# Patient Record
Sex: Male | Born: 1935 | Race: White | Hispanic: No | Marital: Single
Health system: Southern US, Community
[De-identification: ages and names within clinical notes are randomized; demographics above are authoritative.]

---

## 2004-07-26 ENCOUNTER — Ambulatory Visit: Payer: Self-pay | Admitting: Gastroenterology

## 2004-10-25 ENCOUNTER — Ambulatory Visit: Payer: Self-pay | Admitting: Gastroenterology

## 2005-01-09 ENCOUNTER — Ambulatory Visit: Payer: Self-pay | Admitting: Gastroenterology

## 2005-01-28 ENCOUNTER — Encounter: Payer: Self-pay | Admitting: Gastroenterology

## 2005-10-24 ENCOUNTER — Ambulatory Visit: Payer: Self-pay | Admitting: Family Medicine

## 2005-11-06 ENCOUNTER — Ambulatory Visit: Payer: Self-pay | Admitting: Family Medicine

## 2005-11-15 ENCOUNTER — Ambulatory Visit: Payer: Self-pay | Admitting: Internal Medicine

## 2005-12-07 ENCOUNTER — Emergency Department: Payer: Self-pay | Admitting: Emergency Medicine

## 2006-02-16 ENCOUNTER — Other Ambulatory Visit: Payer: Self-pay

## 2006-02-17 ENCOUNTER — Other Ambulatory Visit: Payer: Self-pay

## 2006-02-17 ENCOUNTER — Inpatient Hospital Stay: Payer: Self-pay | Admitting: Internal Medicine

## 2006-02-18 ENCOUNTER — Other Ambulatory Visit: Payer: Self-pay

## 2007-05-17 ENCOUNTER — Ambulatory Visit: Payer: Self-pay | Admitting: Emergency Medicine

## 2007-12-01 ENCOUNTER — Other Ambulatory Visit: Payer: Self-pay

## 2007-12-01 ENCOUNTER — Ambulatory Visit: Payer: Self-pay | Admitting: Family Medicine

## 2007-12-01 ENCOUNTER — Inpatient Hospital Stay: Payer: Self-pay | Admitting: Specialist

## 2008-02-03 ENCOUNTER — Ambulatory Visit: Payer: Self-pay | Admitting: Family Medicine

## 2008-02-25 ENCOUNTER — Other Ambulatory Visit: Payer: Self-pay

## 2008-02-25 ENCOUNTER — Inpatient Hospital Stay: Payer: Self-pay | Admitting: Internal Medicine

## 2008-02-29 ENCOUNTER — Ambulatory Visit: Payer: Self-pay | Admitting: Vascular Surgery

## 2008-03-04 ENCOUNTER — Inpatient Hospital Stay: Payer: Self-pay | Admitting: Surgery

## 2008-03-31 ENCOUNTER — Inpatient Hospital Stay: Payer: Self-pay | Admitting: Internal Medicine

## 2008-04-19 ENCOUNTER — Ambulatory Visit: Payer: Self-pay | Admitting: Oncology

## 2008-04-20 ENCOUNTER — Ambulatory Visit: Payer: Self-pay | Admitting: Gastroenterology

## 2008-05-02 ENCOUNTER — Ambulatory Visit: Payer: Self-pay | Admitting: Family Medicine

## 2008-05-20 ENCOUNTER — Ambulatory Visit: Payer: Self-pay | Admitting: Oncology

## 2008-06-14 ENCOUNTER — Inpatient Hospital Stay: Payer: Self-pay | Admitting: Internal Medicine

## 2008-07-04 ENCOUNTER — Encounter: Payer: Self-pay | Admitting: Cardiovascular Disease

## 2008-07-21 ENCOUNTER — Encounter: Payer: Self-pay | Admitting: Cardiovascular Disease

## 2008-08-13 ENCOUNTER — Inpatient Hospital Stay: Payer: Self-pay | Admitting: *Deleted

## 2008-08-18 ENCOUNTER — Encounter: Payer: Self-pay | Admitting: Cardiovascular Disease

## 2008-08-31 ENCOUNTER — Emergency Department: Payer: Self-pay | Admitting: Emergency Medicine

## 2008-09-17 ENCOUNTER — Encounter: Payer: Self-pay | Admitting: Cardiovascular Disease

## 2008-10-02 ENCOUNTER — Emergency Department: Payer: Self-pay | Admitting: Emergency Medicine

## 2008-10-04 ENCOUNTER — Ambulatory Visit: Payer: Self-pay

## 2008-10-07 ENCOUNTER — Emergency Department: Payer: Self-pay | Admitting: Emergency Medicine

## 2008-10-12 ENCOUNTER — Emergency Department: Payer: Self-pay

## 2008-10-16 ENCOUNTER — Emergency Department: Payer: Self-pay | Admitting: Emergency Medicine

## 2008-10-18 ENCOUNTER — Encounter: Payer: Self-pay | Admitting: Cardiovascular Disease

## 2008-10-21 ENCOUNTER — Ambulatory Visit: Payer: Self-pay | Admitting: Gastroenterology

## 2008-10-28 ENCOUNTER — Ambulatory Visit: Payer: Self-pay | Admitting: Gastroenterology

## 2008-11-27 ENCOUNTER — Ambulatory Visit: Payer: Self-pay | Admitting: Internal Medicine

## 2008-11-30 ENCOUNTER — Ambulatory Visit: Payer: Self-pay | Admitting: Gastroenterology

## 2009-04-03 ENCOUNTER — Ambulatory Visit: Payer: Self-pay | Admitting: Gastroenterology

## 2009-05-29 ENCOUNTER — Ambulatory Visit: Payer: Self-pay | Admitting: Urology

## 2009-06-08 ENCOUNTER — Encounter: Payer: Self-pay | Admitting: Family Medicine

## 2009-06-20 ENCOUNTER — Encounter: Payer: Self-pay | Admitting: Family Medicine

## 2009-06-27 ENCOUNTER — Ambulatory Visit: Payer: Self-pay | Admitting: Family Medicine

## 2009-07-18 ENCOUNTER — Encounter: Payer: Self-pay | Admitting: Family Medicine

## 2009-08-18 ENCOUNTER — Encounter: Payer: Self-pay | Admitting: Family Medicine

## 2010-01-02 IMAGING — US ABDOMEN ULTRASOUND
1 series · 17 of 25 positions shown · non-contrast
Comparison: none

REASON FOR EXAM: RUQ/epigastric pain - h/o cholecystectomy
COMMENTS:

PROCEDURE:     US  - US ABDOMEN GENERAL SURVEY  - August 31, 2008 [DATE]
RESULT:     Comparison: CT abdomen 10/24/2005 and 11/15/2005, ultrasound
02/03/2008
TECHNIQUE: Multiple gray-scale and color-flow Doppler images of the abdomen
are presented for review.

[Series 1: abdomen ultrasound · 17 of 43 slices shown]
[im 1/43]
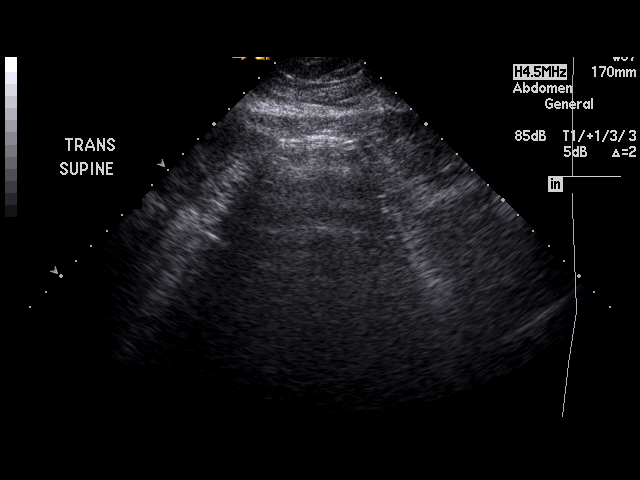
[im 4/43]
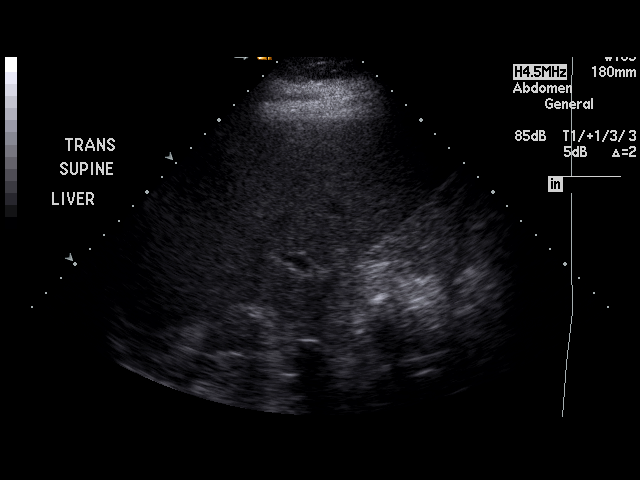
[im 6/43]
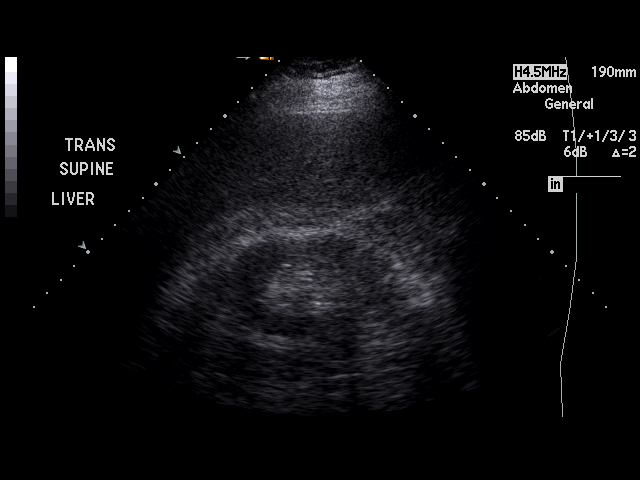
[im 9/43]
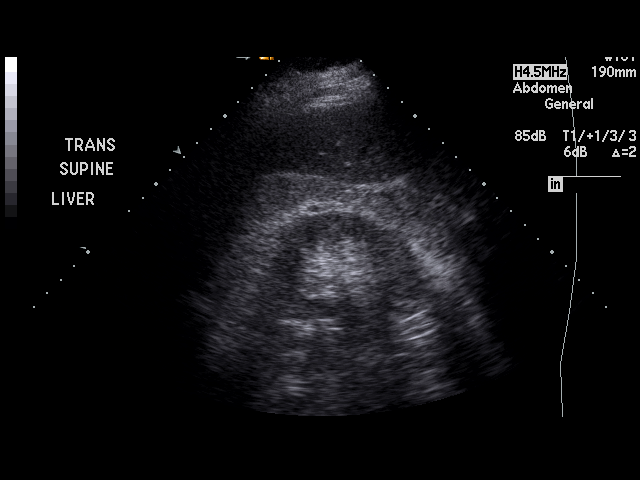
[im 11/43]
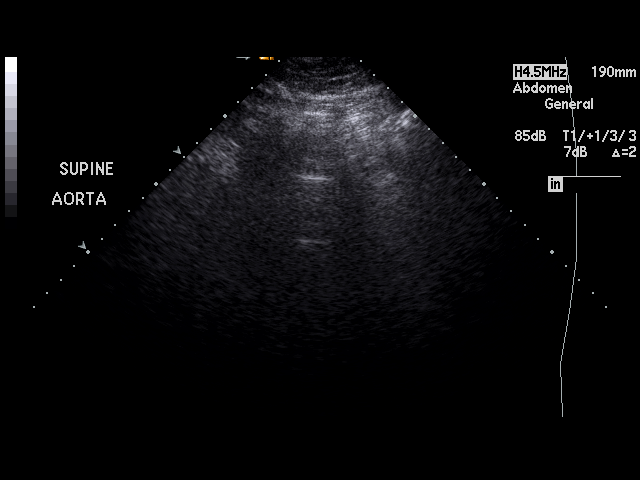
[im 15/43]
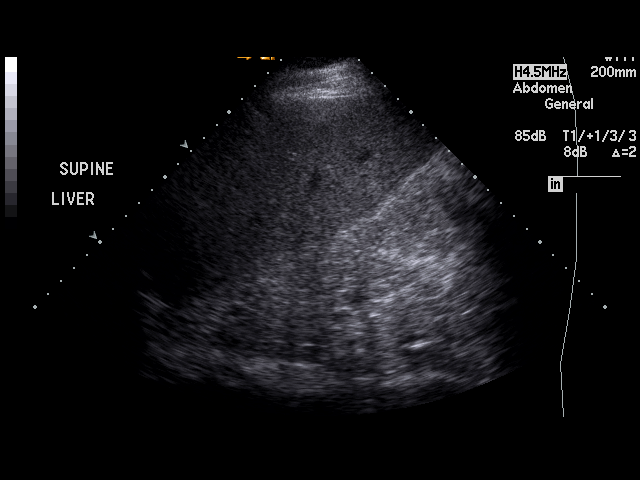
[im 16/43]
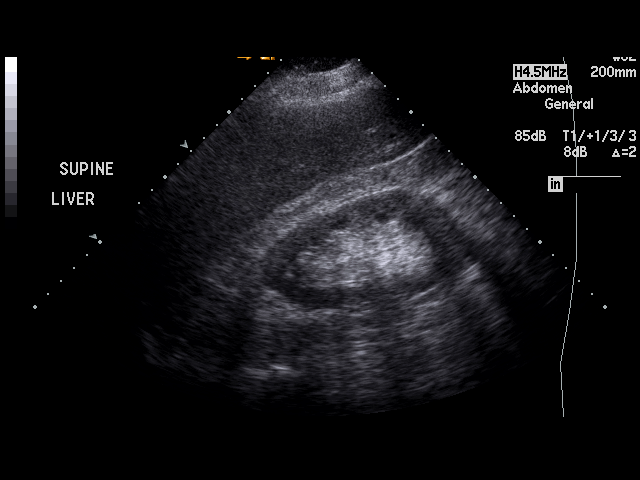
[im 20/43]
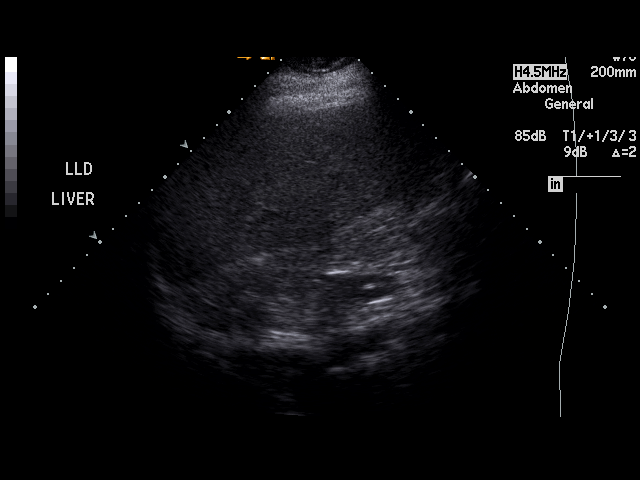
[im 22/43]
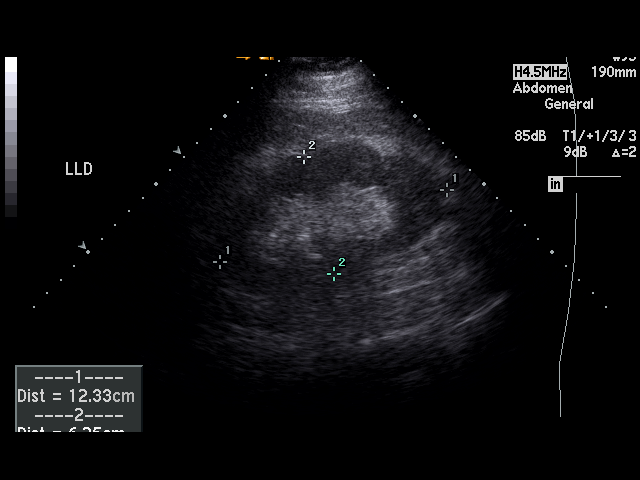
[im 23/43]
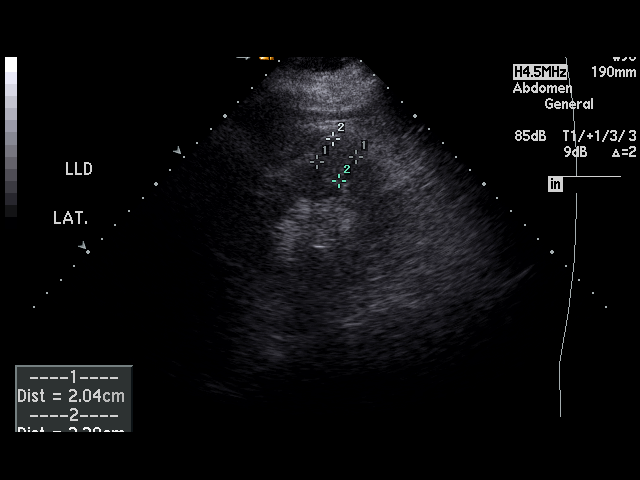
[im 27/43]
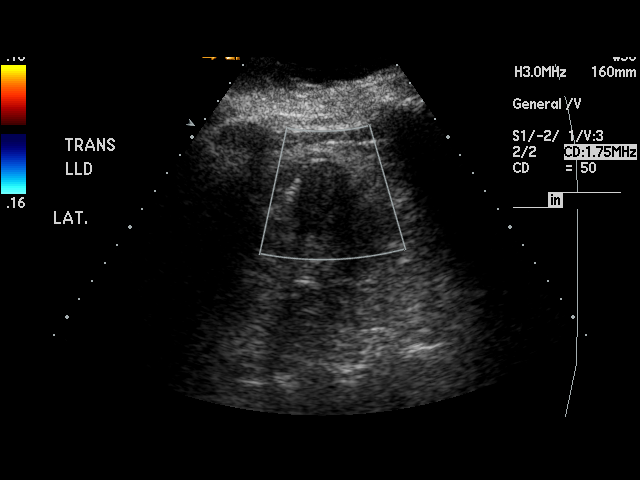
[im 29/43]
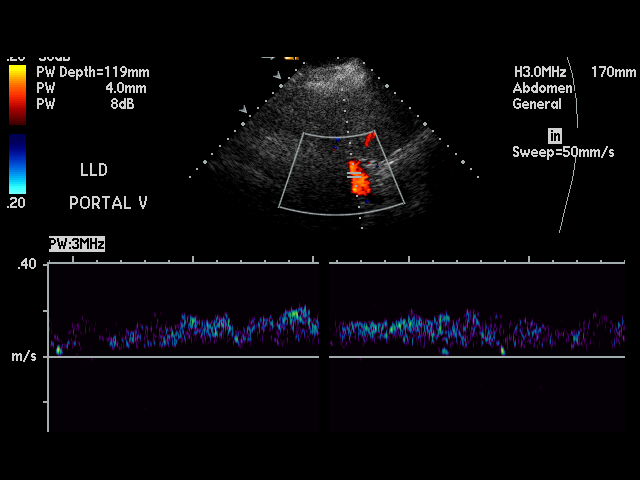
[im 32/43]
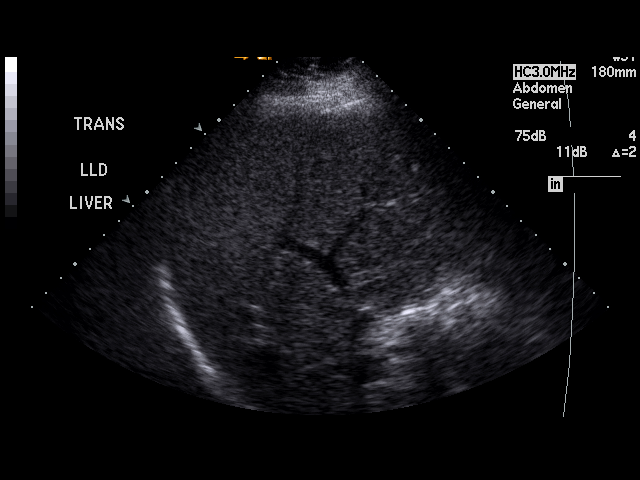
[im 34/43]
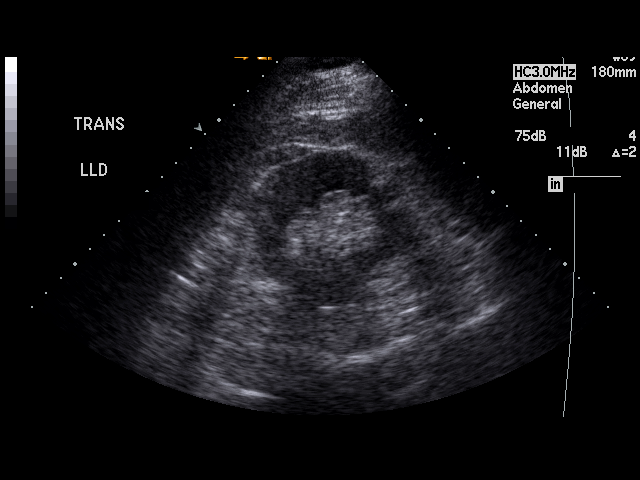
[im 37/43]
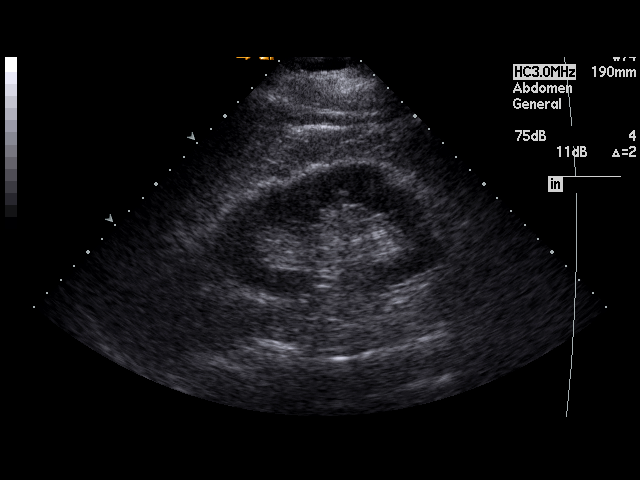
[im 39/43]
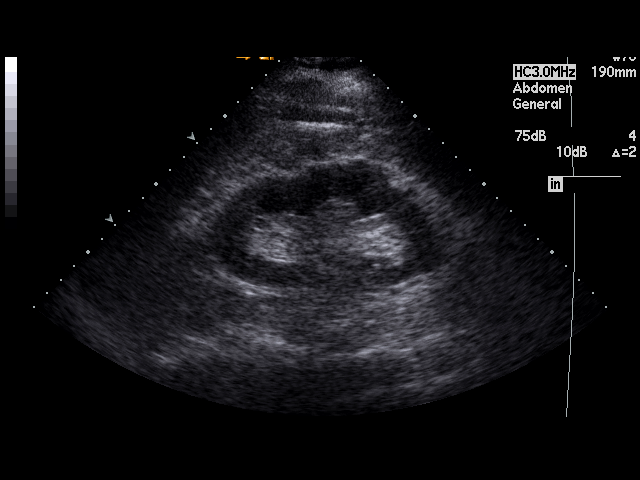
[im 43/43]
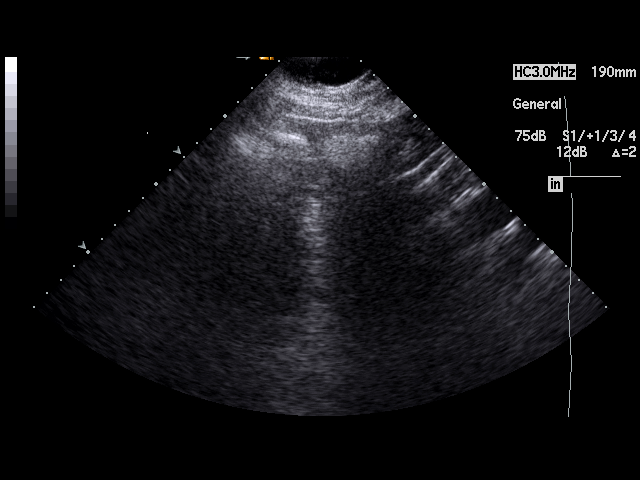

[17 of 25 positions shown; findings below may reference images not displayed]

FINDINGS: Visualized portions of the liver demonstrate normal echogenicity and normal
contours. The liver is without evidence of  focal hepatic lesion.

There is no intra or extrahepatic biliary ductal dilatation. The common duct
measures 5. mm in maximal diameter. The gallbladder is surgically absent.

The pancreas is not visualized secondary to overlying bowel gas. The spleen
is unremarkable. Bilateral kidneys are normal in echogenicity and size. The
right kidney measures 12.3 cm. The left kidney measures 12.0 cm. There are
no renal calculi or hydronephrosis. There is a 2 x 2 x 2.1 cm hypoechoic
mass in the lateral aspect of the right kidney again demonstrated The
abdominal aorta and IVC are suboptimally visualized secondary to overlying
bowel gas..
IMPRESSION: 1. No acute abdominal pathology.
2. Stable right renal hypoechoic mass compared with prior ultrasound dated
02/03/2008. Prior CT examination dated 10/24/2005 and 11/15/2005 do not
demonstrate this mass to be consistent with an angiomyolipoma. Continued
followup is recommended. A multiphasic CT for evaluation of a renal mass may
be helpful.

## 2010-02-17 IMAGING — US ABDOMEN ULTRASOUND
1 series · 17 of 25 positions shown · non-contrast
Comparison: none

REASON FOR EXAM: upper abd pain
COMMENTS:

[Series 1: abdomen ultrasound · 17 of 46 slices shown]
[im 1/46]
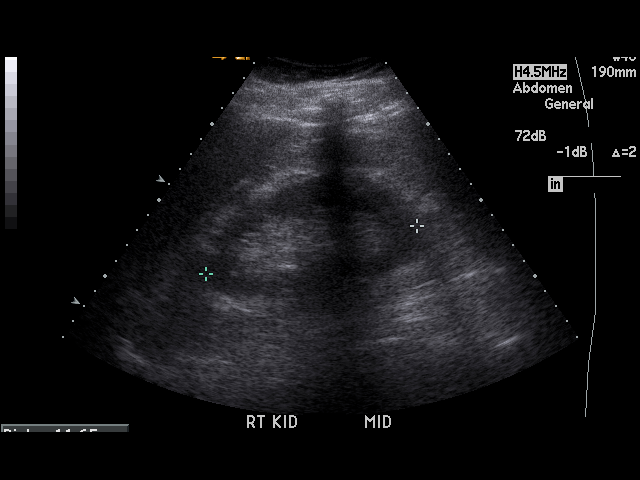
[im 4/46]
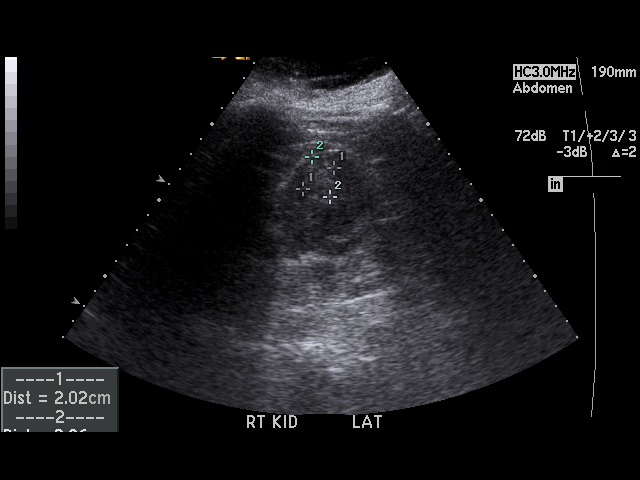
[im 6/46]
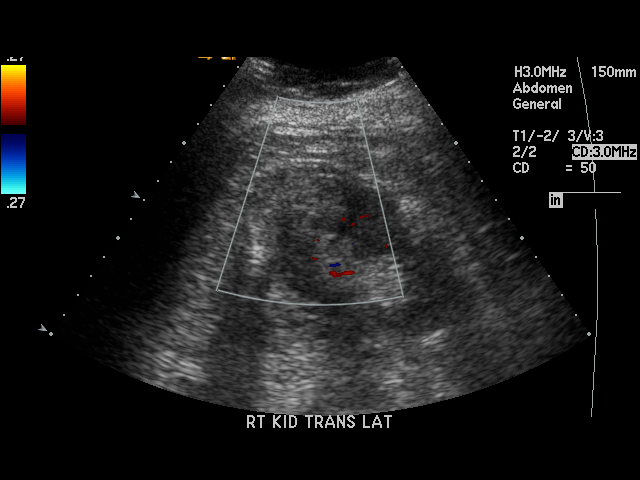
[im 10/46]
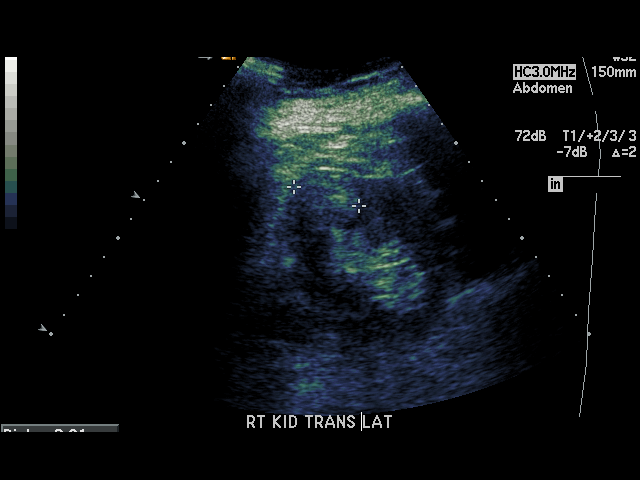
[im 12/46]
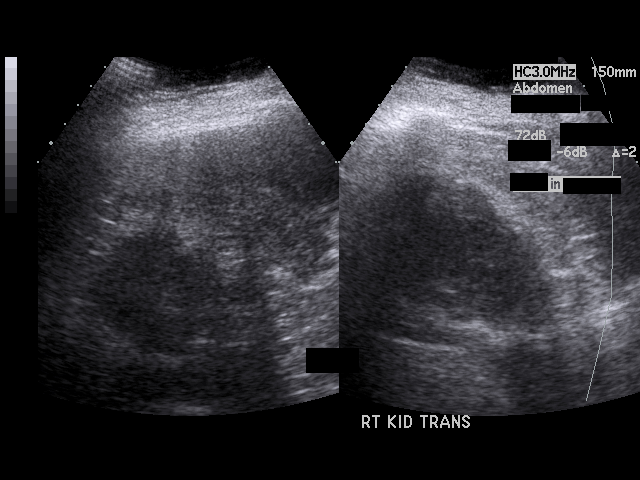
[im 16/46]
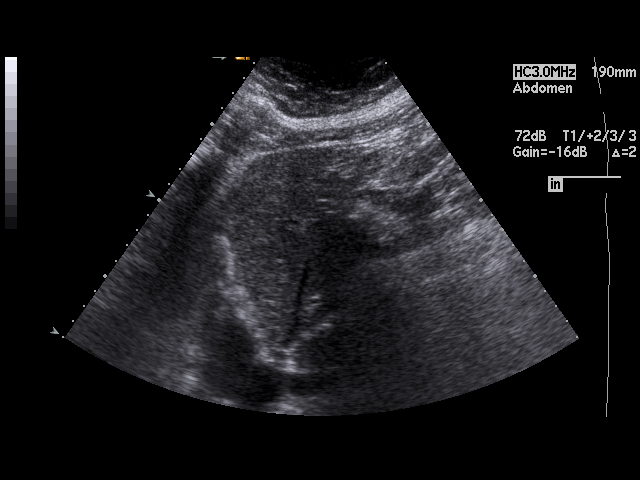
[im 17/46]
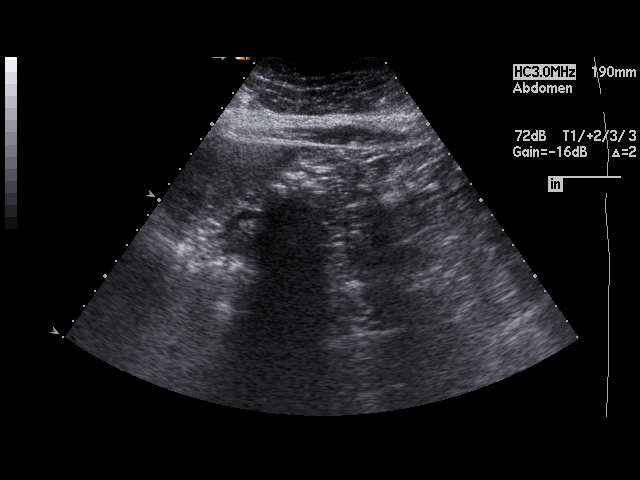
[im 21/46]
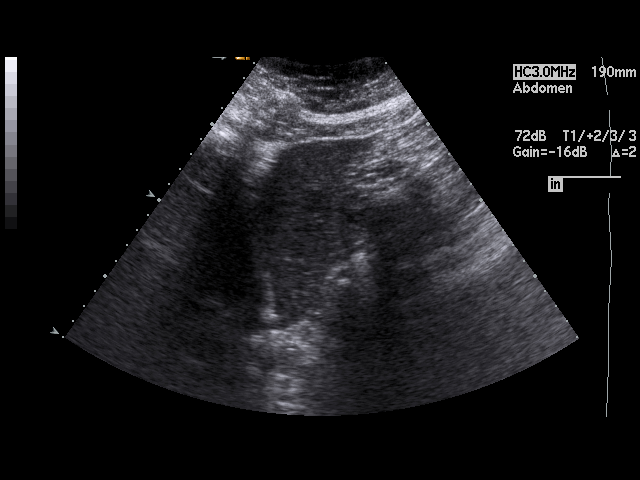
[im 23/46]
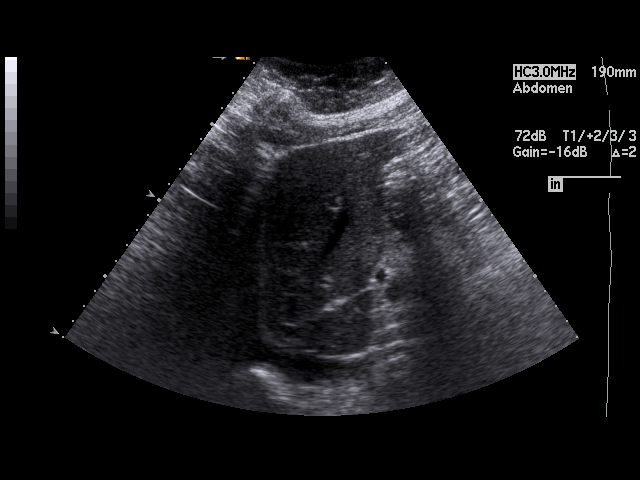
[im 25/46]
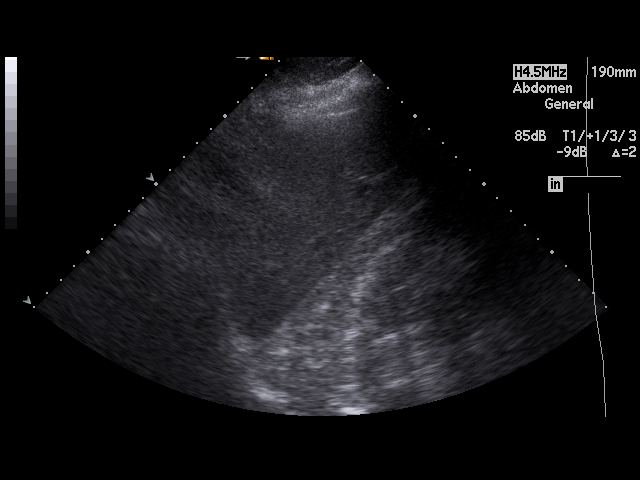
[im 29/46]
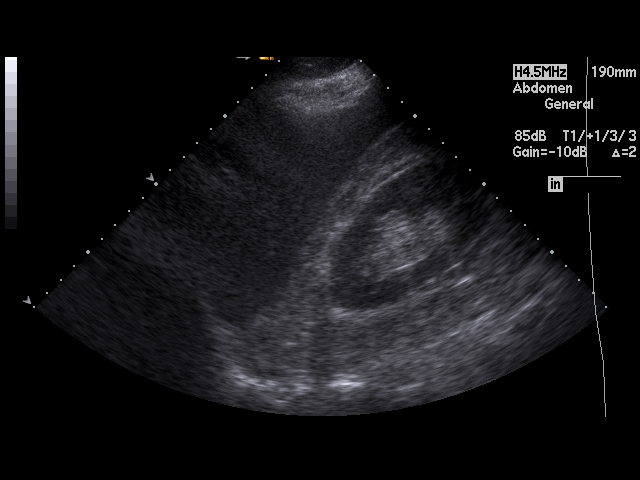
[im 31/46]
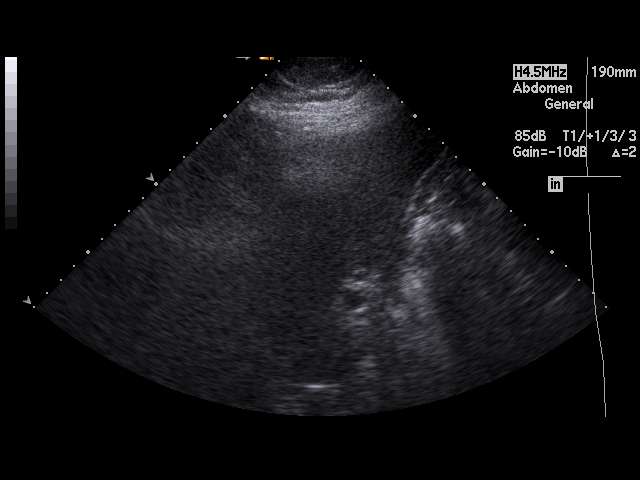
[im 34/46]
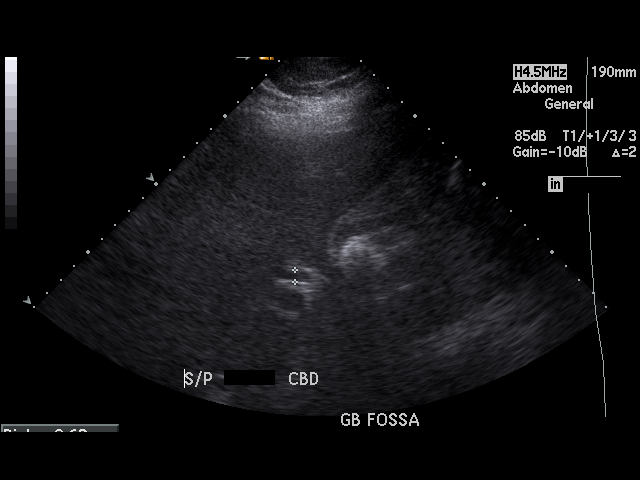
[im 36/46]
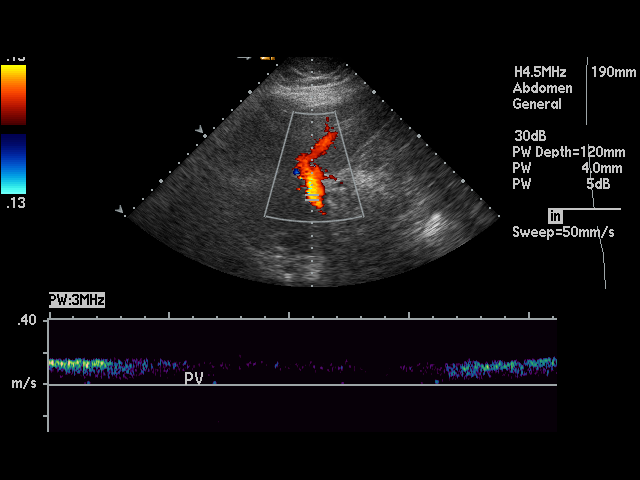
[im 40/46]
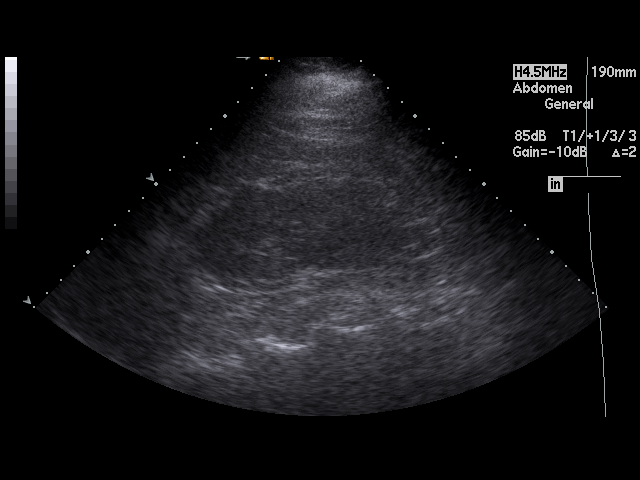
[im 42/46]
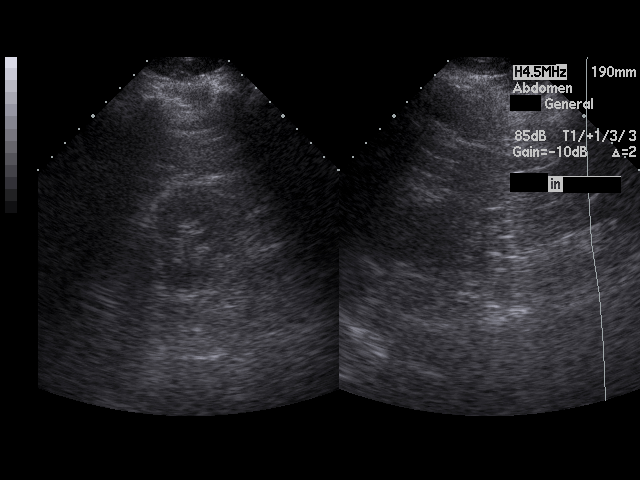
[im 46/46]
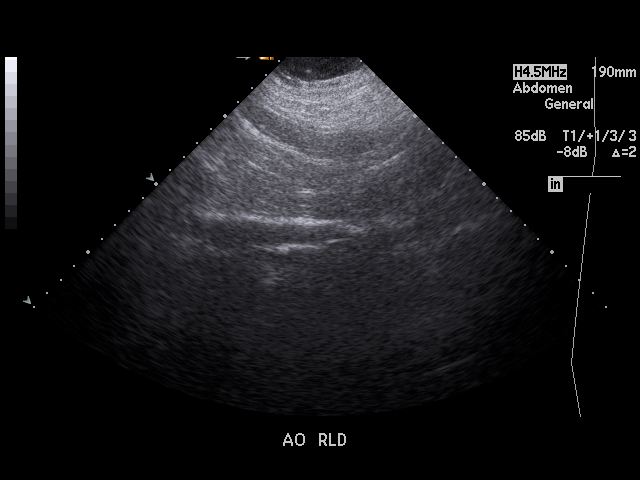

[17 of 25 positions shown; findings below may reference images not displayed]

PROCEDURE:     US  - US ABDOMEN GENERAL SURVEY  - October 16, 2008 [DATE]

RESULT:     Noted on today's examination is a hyperechoic 2.9 cm mass.On
prior Abdomen Ultrasound of 08/31/2008 this mass was noted to be 2.1 cm in
maximum diameter. The mass is hyperechoic; this could represent a benign
angiomyolipoma or given interval increase in size, renal cell carcinoma
could present in this fashion and urology consult is suggested. Triphasic CT
may be needed for further evaluation. Liver normal. Pancreas is
nonvisualized. Prior cholecystectomy. Common bile duct 6.2 mm. No
hydronephrosis, the right kidney measures 11.7 cm in maximum diameter the
left 12.5 cm.
IMPRESSION: Hyperechoic right renal mass. Urology consult suggested as
renal cell carcinoma cannot be excluded as described above. Report phoned to
patient's physician at time of study.

## 2010-03-01 IMAGING — CT CT CHEST-ABD-PELV W/O CM
1 of 2 series · 14 of 32 positions shown, 18 images · non-contrast
Comparison: 05/02/2008, CT abdomen 10/24/2005

REASON FOR EXAM: elevated creatinine   epigastric pain   abnormal US
renal mass  per office ...
COMMENTS:

PROCEDURE:     CT  - CT CHEST ABDOMEN AND PELVIS WO  - October 28, 2008  [DATE]
RESULT:     CT CHEST, ABDOMEN, AND PELVIS
HISTORY: Elevated creatinine. Epigastric pain. Abnormal ultrasound of the
kidneys.
TECHNIQUE: Multiple axial images obtained from the thoracic inlet to the
pubic symphysis, with p.o. contrast and without intravenous contrast.

[Series 2: soft tissue · axial · 0.86mm/px · z∈[-693,-93]mm · 14 of 132 slices shown, 18 images]
[im 6/132  soft-tissue]
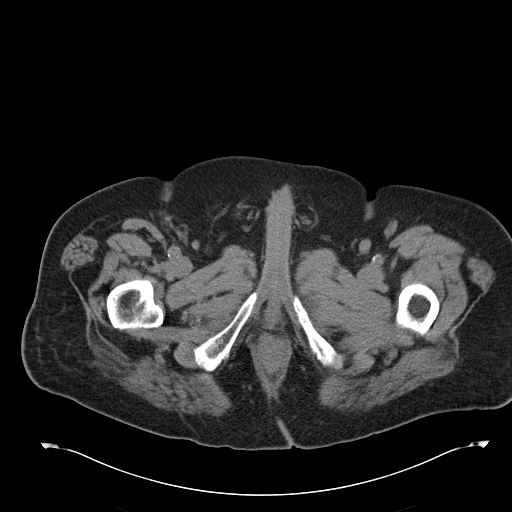
[im 6/132  bone]
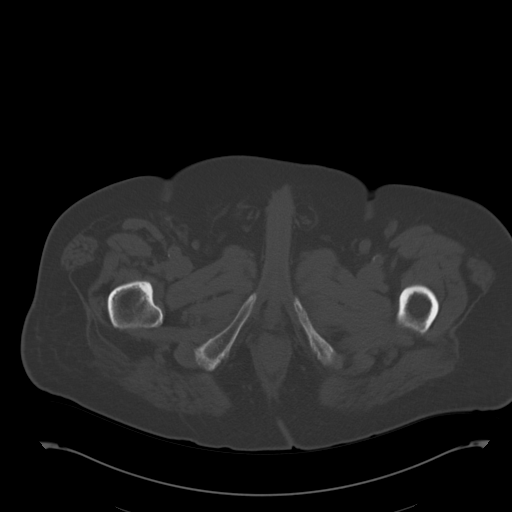
[im 18/132  soft-tissue]
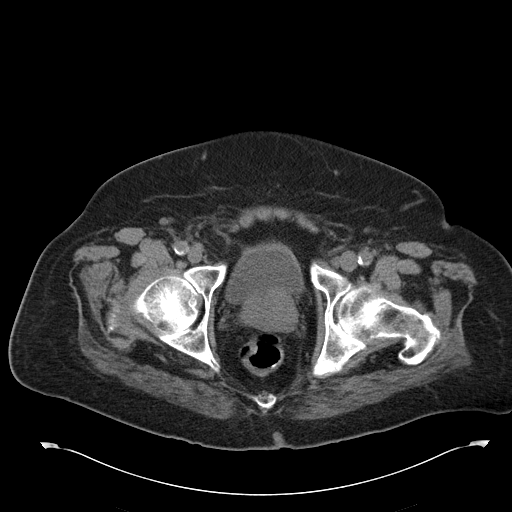
[im 30/132  soft-tissue]
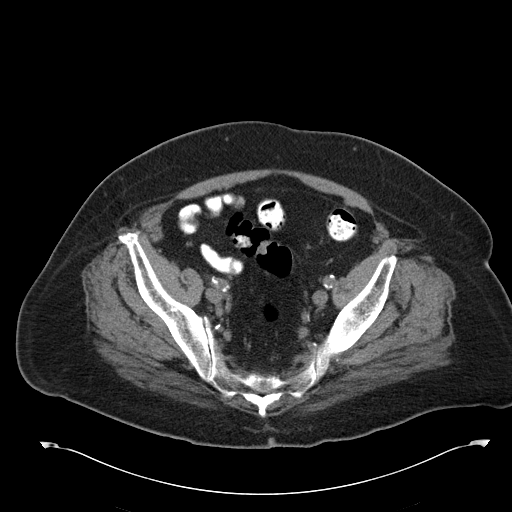
[im 42/132  soft-tissue]
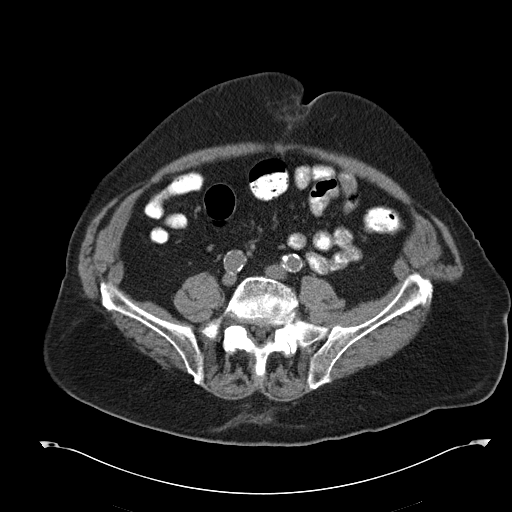
[im 48/132  soft-tissue]
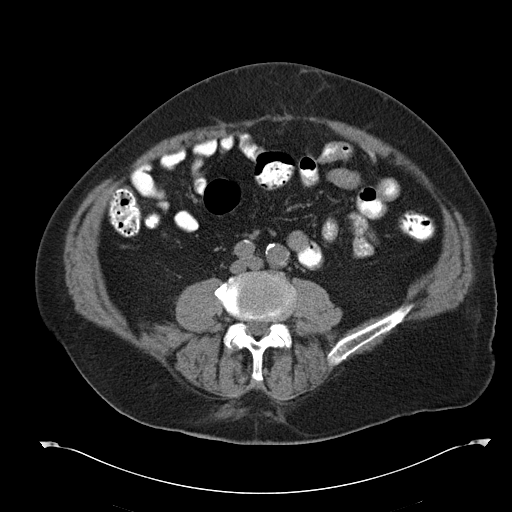
[im 60/132  soft-tissue]
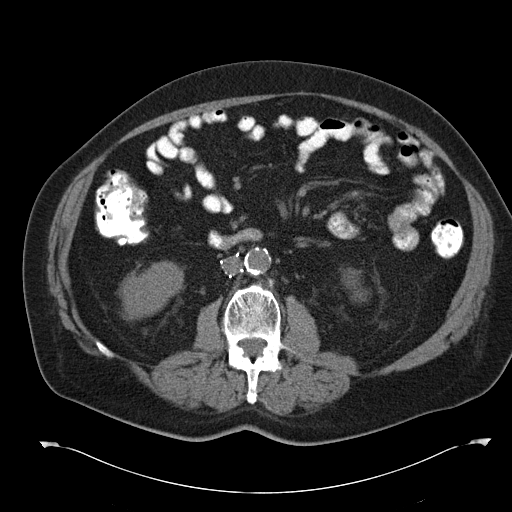
[im 72/132  soft-tissue]
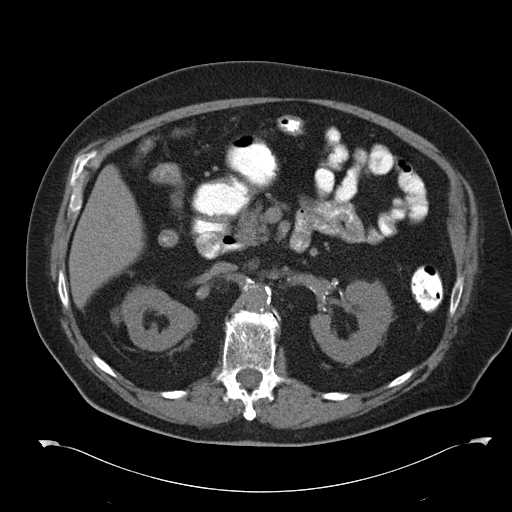
[im 84/132  soft-tissue]
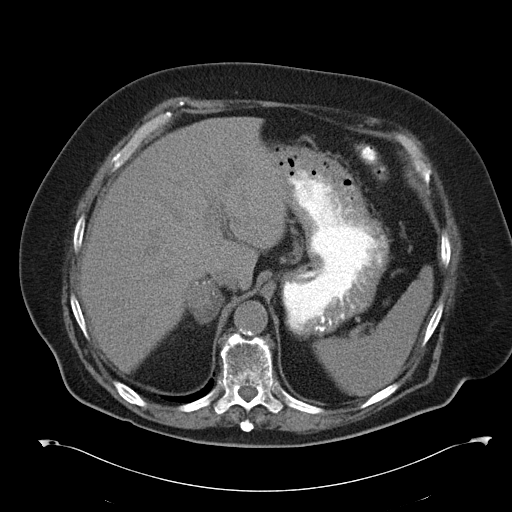
[im 90/132  soft-tissue]
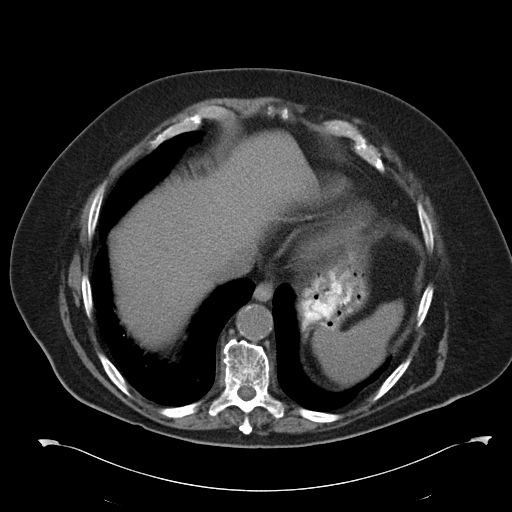
[im 90/132  bone]
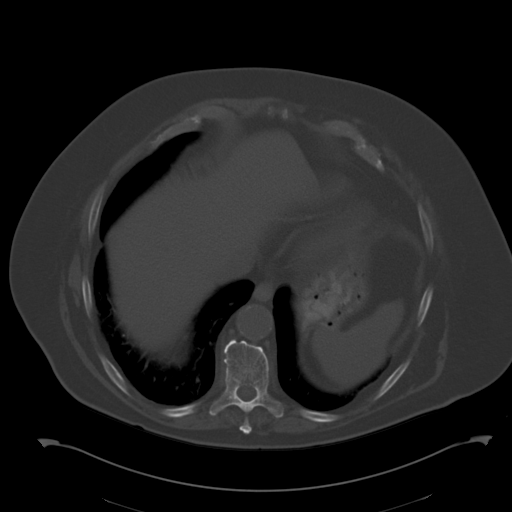
[im 102/132  soft-tissue]
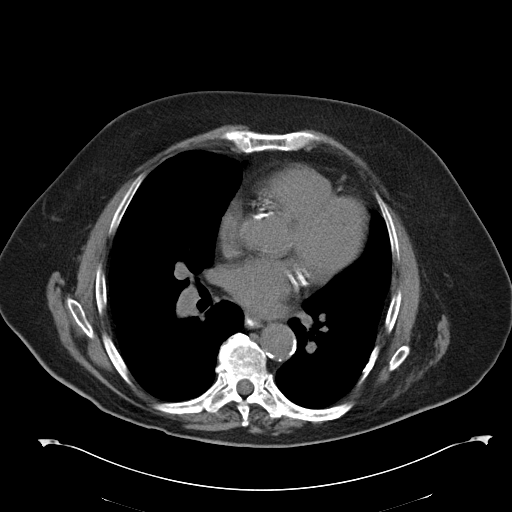
[im 108/132  lung]
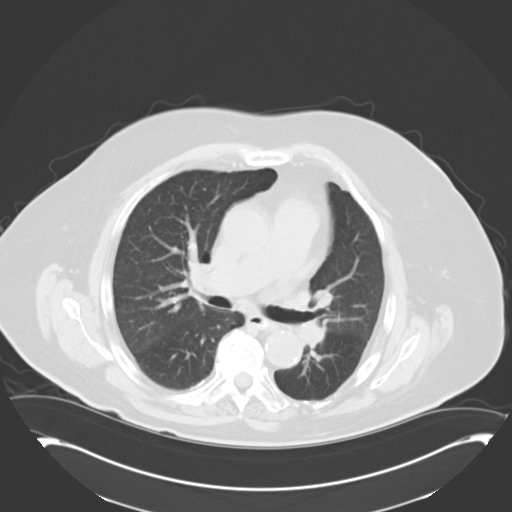
[im 114/132  soft-tissue]
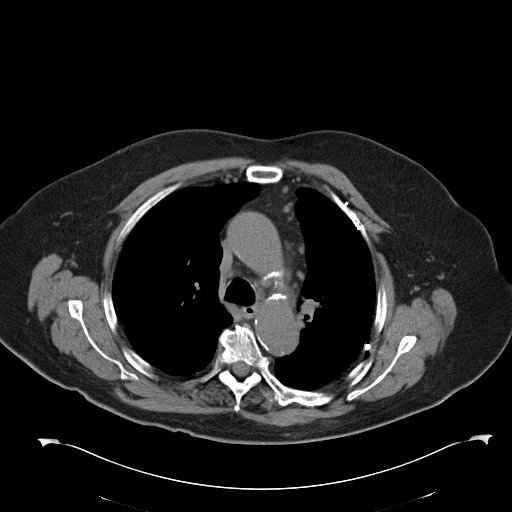
[im 114/132  lung]
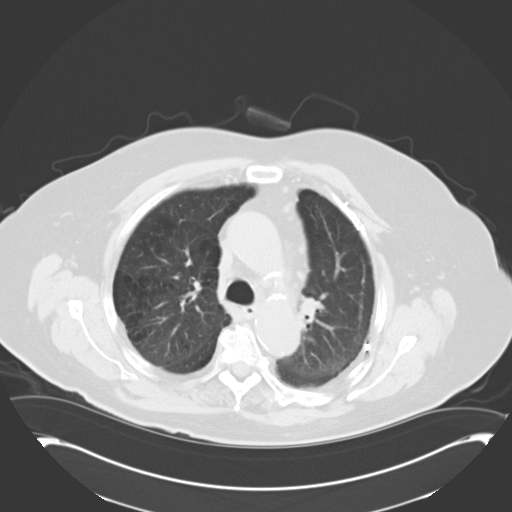
[im 120/132  lung]
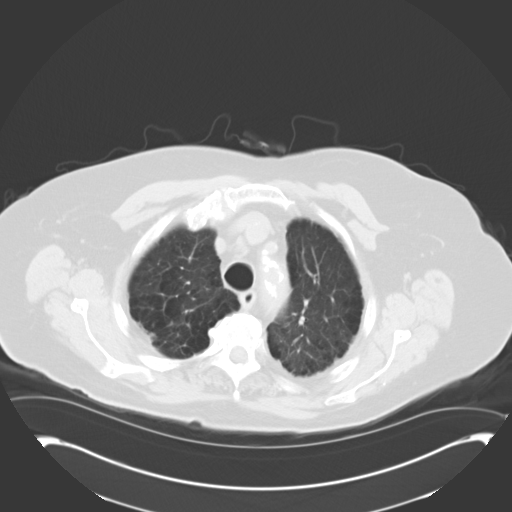
[im 126/132  soft-tissue]
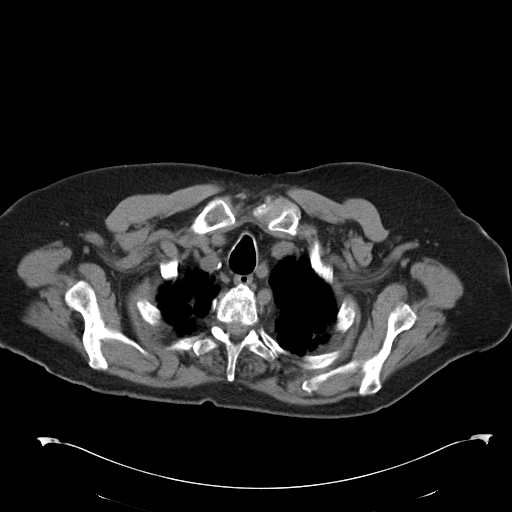
[im 126/132  lung]
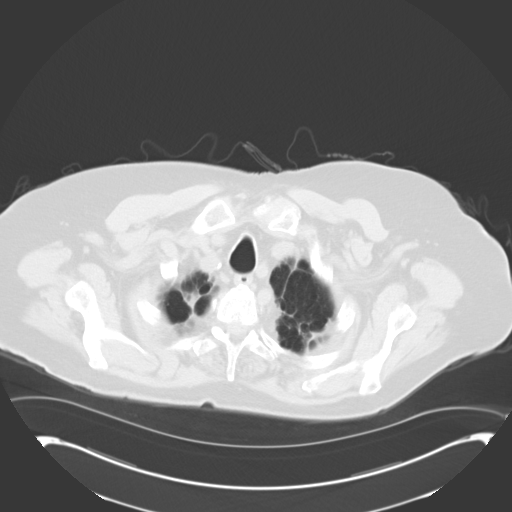

[14 of 32 positions shown; findings below may reference images not displayed]

FINDINGS: CHEST:

The lungs are clear. There are bilateral emphysematous changes predominately
involving the upper lobes. There is bullous change at the right lung apex.
There is biapical scarring. There is no focal parenchymal opacity, pleural
effusion, or pneumothorax.

The heart size is normal. There is no pericardial effusion. Coronary artery
calcifications are noted.

There are no pathologically enlarged mediastinal, hilar, or axillary lymph
nodes.

The osseous structures demonstrate no focal abnormality.

ABDOMEN/PELVIS:

The liver demonstrates no focal abnormality. There is no intrahepatic or
extrahepatic biliary ductal dilatation. The gallbladder is unremarkable. The
spleen demonstrates no focal abnormality. The left adrenal gland, pancreas
are normal. The bladder is unremarkable.

There is a heterogeneous right adrenal mass measuring 4 x 3.5 cm which is
similar in size to the prior examination of 10/24/2005.

There is a 2.8 x 3 cm heterogeneous right renal soft tissue mass along the
interpolar aspect with peripheral hyper echogenicity and more central lower
attenuation which may represent necrosis. The renal arteries and veins
cannot be assessed secondary to lack of intravenous contrast. There is no
retroperitoneal lymphadenopathy.

The stomach, duodenum, small intestine, and large intestine demonstrate no
contrast extravasation or dilatation. There is no pneumoperitoneum,
pneumatosis, or portal venous gas. There is no abdominal or pelvic free
fluid. There is no lymphadenopathy.

The abdominal aorta is normal in caliber with atherosclerosis. There is an
inferior vena caval filter present with the tip below the level of the renal
veins.

The osseous structures are unremarkable.
IMPRESSION: 1. There is a 2.8 x 3 cm heterogeneous right renal soft tissue mass along
the interpolar aspect with peripheral hyper echogenicity and more central
lower attenuation which may represent necrosis. The renal arteries and veins
cannot be assessed secondary to lack of intravenous contrast. There is no
retroperitoneal lymphadenopathy. The overall appearance of the right renal
mass is concerning for renal cell carcinoma until proven otherwise. The mass
is incompletely characterized on this exam as contrast cannot be
administered secondary to elevated creatinine. Urology consultation is
recommended.

2. Indeterminate heterogeneous right adrenal mass measuring 4 x 3.5 cm which
is similar in size to the prior examination of 10/24/2005. This mass does not
meet CT criteria for an adrenal adenoma.

## 2010-03-18 ENCOUNTER — Ambulatory Visit: Payer: Self-pay | Admitting: Internal Medicine

## 2010-04-10 ENCOUNTER — Ambulatory Visit: Payer: Self-pay

## 2010-06-05 ENCOUNTER — Ambulatory Visit: Payer: Self-pay | Admitting: Urology

## 2010-10-05 ENCOUNTER — Emergency Department: Payer: Self-pay | Admitting: Emergency Medicine

## 2010-10-08 ENCOUNTER — Inpatient Hospital Stay: Payer: Self-pay | Admitting: Internal Medicine

## 2010-10-10 LAB — PATHOLOGY REPORT

## 2010-11-29 ENCOUNTER — Inpatient Hospital Stay: Payer: Self-pay | Admitting: Internal Medicine

## 2011-05-19 ENCOUNTER — Inpatient Hospital Stay: Payer: Self-pay | Admitting: Internal Medicine

## 2011-05-21 LAB — BASIC METABOLIC PANEL
Calcium, Total: 8.8 mg/dL (ref 8.5–10.1)
Chloride: 104 mmol/L (ref 98–107)
Co2: 29 mmol/L (ref 21–32)
Creatinine: 2.53 mg/dL — ABNORMAL HIGH (ref 0.60–1.30)
Sodium: 142 mmol/L (ref 136–145)

## 2011-05-21 LAB — PROTIME-INR
INR: 3
Prothrombin Time: 30.5 secs — ABNORMAL HIGH (ref 11.5–14.7)

## 2011-05-22 LAB — BASIC METABOLIC PANEL
Calcium, Total: 8.7 mg/dL (ref 8.5–10.1)
Chloride: 106 mmol/L (ref 98–107)
Co2: 27 mmol/L (ref 21–32)
Creatinine: 2.38 mg/dL — ABNORMAL HIGH (ref 0.60–1.30)
EGFR (African American): 35 — ABNORMAL LOW
Potassium: 5.1 mmol/L (ref 3.5–5.1)
Sodium: 143 mmol/L (ref 136–145)

## 2011-05-22 LAB — PROTIME-INR: INR: 3

## 2011-06-27 ENCOUNTER — Inpatient Hospital Stay: Payer: Self-pay | Admitting: Internal Medicine

## 2011-06-27 LAB — PROTIME-INR
INR: 4.1
Prothrombin Time: 39.8 secs — ABNORMAL HIGH (ref 11.5–14.7)

## 2011-06-27 LAB — URINALYSIS, COMPLETE
Bacteria: NONE SEEN
Bilirubin,UR: NEGATIVE
Blood: NEGATIVE
Glucose,UR: 50 mg/dL (ref 0–75)
Hyaline Cast: 3
Leukocyte Esterase: NEGATIVE
Ph: 5 (ref 4.5–8.0)
Protein: 100
Specific Gravity: 1.009 (ref 1.003–1.030)
Squamous Epithelial: <1

## 2011-06-27 LAB — CBC
HCT: 27.8 % — ABNORMAL LOW (ref 40.0–52.0)
HGB: 9.3 g/dL — ABNORMAL LOW (ref 13.0–18.0)
MCH: 29.7 pg (ref 26.0–34.0)
MCHC: 33.4 g/dL (ref 32.0–36.0)
RDW: 15.3 % — ABNORMAL HIGH (ref 11.5–14.5)
WBC: 11.1 10*3/uL — ABNORMAL HIGH (ref 3.8–10.6)

## 2011-06-27 LAB — COMPREHENSIVE METABOLIC PANEL
Albumin: 2.8 g/dL — ABNORMAL LOW (ref 3.4–5.0)
BUN: 40 mg/dL — ABNORMAL HIGH (ref 7–18)
EGFR (African American): 43 — ABNORMAL LOW
Glucose: 287 mg/dL — ABNORMAL HIGH (ref 65–99)
Potassium: 5.1 mmol/L (ref 3.5–5.1)
Sodium: 136 mmol/L (ref 136–145)
Total Protein: 6.1 g/dL — ABNORMAL LOW (ref 6.4–8.2)

## 2011-06-27 LAB — PRO B NATRIURETIC PEPTIDE: B-Type Natriuretic Peptide: 8881 pg/mL — ABNORMAL HIGH (ref 0–125)

## 2011-06-27 LAB — CK TOTAL AND CKMB (NOT AT ARMC): CK-MB: 3 ng/mL (ref 0.5–3.6)

## 2011-06-27 LAB — TROPONIN I: Troponin-I: 0.04 ng/mL

## 2011-06-28 LAB — CBC WITH DIFFERENTIAL/PLATELET
Eosinophil %: 0.9 %
HGB: 9.7 g/dL — ABNORMAL LOW (ref 13.0–18.0)
Lymphocyte #: 1.1 10*3/uL (ref 1.0–3.6)
Lymphocyte %: 10.7 %
MCH: 29.6 pg (ref 26.0–34.0)
MCV: 89 fL (ref 80–100)
Monocyte #: 0.8 10*3/uL — ABNORMAL HIGH (ref 0.0–0.7)
Neutrophil %: 80.4 %
Platelet: 269 10*3/uL (ref 150–440)
RBC: 3.27 10*6/uL — ABNORMAL LOW (ref 4.40–5.90)
WBC: 9.9 10*3/uL (ref 3.8–10.6)

## 2011-06-28 LAB — BASIC METABOLIC PANEL
Calcium, Total: 8.7 mg/dL (ref 8.5–10.1)
Chloride: 104 mmol/L (ref 98–107)
Co2: 29 mmol/L (ref 21–32)
Potassium: 4.3 mmol/L (ref 3.5–5.1)
Sodium: 142 mmol/L (ref 136–145)

## 2011-06-28 LAB — TROPONIN I
Troponin-I: 0.07 ng/mL — ABNORMAL HIGH
Troponin-I: 0.06 ng/mL — ABNORMAL HIGH

## 2011-06-28 LAB — FERRITIN: Ferritin (ARMC): 105 ng/mL (ref 8–388)

## 2011-06-28 LAB — IRON AND TIBC
Iron Bind.Cap.(Total): 271 ug/dL (ref 250–450)
Iron: 41 ug/dL — ABNORMAL LOW (ref 65–175)
Unbound Iron-Bind.Cap.: 230 ug/dL

## 2011-06-28 LAB — CK TOTAL AND CKMB (NOT AT ARMC)
CK, Total: 49 U/L (ref 35–232)
CK, Total: 56 U/L (ref 35–232)
CK-MB: 2.6 ng/mL (ref 0.5–3.6)

## 2011-06-28 LAB — PROTIME-INR
INR: 4.5
Prothrombin Time: 42.4 secs — ABNORMAL HIGH (ref 11.5–14.7)

## 2011-06-29 LAB — BASIC METABOLIC PANEL
Calcium, Total: 8.5 mg/dL (ref 8.5–10.1)
Creatinine: 2.05 mg/dL — ABNORMAL HIGH (ref 0.60–1.30)
EGFR (African American): 41 — ABNORMAL LOW
EGFR (Non-African Amer.): 34 — ABNORMAL LOW
Glucose: 81 mg/dL (ref 65–99)
Osmolality: 290 (ref 275–301)
Potassium: 3.9 mmol/L (ref 3.5–5.1)

## 2011-06-29 LAB — CBC WITH DIFFERENTIAL/PLATELET
Basophil #: 0 10*3/uL (ref 0.0–0.1)
Basophil %: 0.3 %
Eosinophil %: 0.7 %
HCT: 27.4 % — ABNORMAL LOW (ref 40.0–52.0)
HGB: 9.1 g/dL — ABNORMAL LOW (ref 13.0–18.0)
Lymphocyte #: 1 10*3/uL (ref 1.0–3.6)
Lymphocyte %: 11.4 %
Monocyte %: 9.4 %
Neutrophil #: 6.8 10*3/uL — ABNORMAL HIGH (ref 1.4–6.5)
Neutrophil %: 78.2 %
RDW: 15.2 % — ABNORMAL HIGH (ref 11.5–14.5)
WBC: 8.7 10*3/uL (ref 3.8–10.6)

## 2011-06-30 LAB — BASIC METABOLIC PANEL
Anion Gap: 9 (ref 7–16)
BUN: 33 mg/dL — ABNORMAL HIGH (ref 7–18)
Calcium, Total: 8.2 mg/dL — ABNORMAL LOW (ref 8.5–10.1)
Chloride: 103 mmol/L (ref 98–107)
Co2: 27 mmol/L (ref 21–32)
Creatinine: 2.01 mg/dL — ABNORMAL HIGH (ref 0.60–1.30)
EGFR (African American): 42 — ABNORMAL LOW
Osmolality: 284 (ref 275–301)
Potassium: 3.7 mmol/L (ref 3.5–5.1)

## 2011-06-30 LAB — PROTIME-INR: Prothrombin Time: 35.5 secs — ABNORMAL HIGH (ref 11.5–14.7)

## 2011-08-07 ENCOUNTER — Inpatient Hospital Stay: Payer: Self-pay | Admitting: Internal Medicine

## 2011-08-07 LAB — TROPONIN I
Troponin-I: <0.02 ng/mL
Troponin-I: 0.02 ng/mL

## 2011-08-07 LAB — COMPREHENSIVE METABOLIC PANEL
Alkaline Phosphatase: 50 U/L (ref 50–136)
BUN: 23 mg/dL — ABNORMAL HIGH (ref 7–18)
Calcium, Total: 8.7 mg/dL (ref 8.5–10.1)
Co2: 28 mmol/L (ref 21–32)
EGFR (Non-African Amer.): 47 — ABNORMAL LOW
Osmolality: 288 (ref 275–301)
SGPT (ALT): 18 U/L

## 2011-08-07 LAB — CBC
HGB: 11.1 g/dL — ABNORMAL LOW (ref 13.0–18.0)
MCH: 28.6 pg (ref 26.0–34.0)
MCHC: 32.6 g/dL (ref 32.0–36.0)
Platelet: 268 10*3/uL (ref 150–440)
RDW: 16.3 % — ABNORMAL HIGH (ref 11.5–14.5)
WBC: 12.5 10*3/uL — ABNORMAL HIGH (ref 3.8–10.6)

## 2011-08-07 LAB — CK TOTAL AND CKMB (NOT AT ARMC)
CK, Total: 38 U/L (ref 35–232)
CK-MB: 2 ng/mL (ref 0.5–3.6)
CK-MB: 2.8 ng/mL (ref 0.5–3.6)

## 2011-08-07 LAB — MAGNESIUM: Magnesium: 2.6 mg/dL — ABNORMAL HIGH

## 2011-08-07 LAB — PRO B NATRIURETIC PEPTIDE: B-Type Natriuretic Peptide: 9881 pg/mL — ABNORMAL HIGH (ref 0–125)

## 2011-08-07 LAB — PROTIME-INR
INR: 2.1
Prothrombin Time: 23.9 secs — ABNORMAL HIGH (ref 11.5–14.7)

## 2011-08-08 LAB — PROTIME-INR
INR: 2.4
Prothrombin Time: 26 secs — ABNORMAL HIGH (ref 11.5–14.7)

## 2011-08-08 LAB — BASIC METABOLIC PANEL
Anion Gap: 14 (ref 7–16)
Co2: 25 mmol/L (ref 21–32)
Creatinine: 1.83 mg/dL — ABNORMAL HIGH (ref 0.60–1.30)
EGFR (African American): 47 — ABNORMAL LOW
Glucose: 173 mg/dL — ABNORMAL HIGH (ref 65–99)
Osmolality: 292 (ref 275–301)
Sodium: 142 mmol/L (ref 136–145)

## 2011-08-08 LAB — CK TOTAL AND CKMB (NOT AT ARMC)
CK, Total: 34 U/L — ABNORMAL LOW (ref 35–232)
CK-MB: 2 ng/mL (ref 0.5–3.6)

## 2011-08-08 LAB — TROPONIN I: Troponin-I: 0.02 ng/mL

## 2011-08-09 LAB — PROTIME-INR
INR: 2.3
Prothrombin Time: 25.2 secs — ABNORMAL HIGH (ref 11.5–14.7)

## 2011-08-09 LAB — CBC WITH DIFFERENTIAL/PLATELET
HCT: 31.7 % — ABNORMAL LOW (ref 40.0–52.0)
HGB: 10.3 g/dL — ABNORMAL LOW (ref 13.0–18.0)
Lymphocyte %: 11.7 %
MCH: 28.4 pg (ref 26.0–34.0)
MCV: 88 fL (ref 80–100)
Monocyte #: 0.6 10*3/uL (ref 0.0–0.7)
Monocyte %: 6.4 %
Neutrophil #: 8.1 10*3/uL — ABNORMAL HIGH (ref 1.4–6.5)
RDW: 16.7 % — ABNORMAL HIGH (ref 11.5–14.5)
WBC: 9.9 10*3/uL (ref 3.8–10.6)

## 2011-08-09 LAB — BASIC METABOLIC PANEL
Anion Gap: 9 (ref 7–16)
BUN: 31 mg/dL — ABNORMAL HIGH (ref 7–18)
Calcium, Total: 8.9 mg/dL (ref 8.5–10.1)
Chloride: 102 mmol/L (ref 98–107)
Co2: 27 mmol/L (ref 21–32)
EGFR (African American): 47 — ABNORMAL LOW
EGFR (Non-African Amer.): 39 — ABNORMAL LOW
Glucose: 124 mg/dL — ABNORMAL HIGH (ref 65–99)
Osmolality: 284 (ref 275–301)
Potassium: 4.2 mmol/L (ref 3.5–5.1)

## 2011-08-10 LAB — BASIC METABOLIC PANEL
BUN: 27 mg/dL — ABNORMAL HIGH (ref 7–18)
Chloride: 103 mmol/L (ref 98–107)
Co2: 29 mmol/L (ref 21–32)
Creatinine: 1.86 mg/dL — ABNORMAL HIGH (ref 0.60–1.30)
Sodium: 139 mmol/L (ref 136–145)

## 2011-08-14 LAB — CULTURE, BLOOD (SINGLE)

## 2012-05-11 ENCOUNTER — Ambulatory Visit: Payer: Self-pay | Admitting: Vascular Surgery

## 2013-02-17 DEATH — deceased

## 2013-03-30 ENCOUNTER — Encounter: Payer: Self-pay | Admitting: Specialist

## 2014-09-11 NOTE — Discharge Summary (Signed)
PATIENT NAME:  Ronald [name], Ronald [name] MR#:  [id number] DATE OF BIRTH:  November 19, 1935  DATE OF ADMISSION:  08/07/2011 DATE OF DISCHARGE:  08/10/2011  DISCHARGE DIAGNOSES:  1. Acute on chronic respiratory failure likely due to acute on chronic systolic congestive heart failure exacerbation.  2. Leukocytosis. 3. Anemia of chronic disease.  4. History of atrial fibrillation. 5. Chronic obstructive pulmonary disease. 6. Chronic kidney disease. 7. Hyperlipidemia. 8. Diabetes.   DISPOSITION: The patient is being discharged home.   DISCHARGE FOLLOWUP/INSTRUCTIONS: Followup with primary care physician and Dr. Darrold JunkerParaschos in 1 to 2 weeks after discharge. The patient's home health has been resumed.  DISCHARGE DIET: Low sodium, 1800 calorie ADA diet.   ACTIVITY: As tolerated.   DISCHARGE MEDICATIONS:  1. Lasix 40 mg twice a day. 2. Tussionex Pennkinetic 5 mL twice a day [name].r.n. cough.  3. Ambien 5 mg at bedtime [name].r.n. insomnia. 4. Benzonatate 100 mg every six hours [name].r.n. cough.  5. Integra F oral capsule once a day.  6. Advair 250/50 one puff twice a day. 7. Imdur 30 mg daily.  8. Carafate 10 mg three times daily. 9. AcipHex 20 mg daily.  10. Coreg 12.5 mg twice a day. 11. Glyburide 6 mg once a day. 12. Zantac 150 mg twice a day. 13. Pravachol 40 mg two tablets once a day.  14. K-Lor one tablet once a day.  15. DuoNebs once a day in the morning and at bedtime as needed for shortness of breath.  16. Combivent 2 puffs every 4 to 6 hours [name].r.n.  17. Hydralazine 50 mg every eight hours.  18. Amlodipine 10 mg daily.  19. Coumadin 5 mg daily.   CONSULTANT: Paraschos, MD - Cardiology.  RESULTS: Chest x-ray showed pulmonary edema.   INR therapeutic at 2.3. White count 12.5 on admission and 9.9 by the time of discharge. Hemoglobin 11.3 to 10.3. Normal platelet count. Creatinine 1.56 on admission and 1.86 by the time of discharge. Cardiac enzymes normal. TSH 2.26. Normal LFTs.   HOSPITAL COURSE: The  patient is 79 year old male with past medical history of congestive heart failure, chronic obstructive pulmonary disease, cardiomyopathy with an ejection fraction of 40%, atrial fibrillation, prior history of cerebrovascular accident, hypertension, obesity, irritable bowel syndrome, coronary artery disease, peripheral vascular disease, and chronic lower extremity edema who presented with shortness of breath and cough. The patient was found to have acute on chronic systolic congestive heart failure exacerbation and was diuresed with Lasix. There was a slight bump in his creatinine due to diuresis, however, thereafter it remained stable. He had some leukocytosis which has resolved. He is afebrile. There was low clinical suspicion for infection. He was initially empirically treated with antibiotics, but they have been discontinued by the time of discharge. His anemia has remained stable as has his chronic atrial fibrillation. His INR is therapeutic. He has a history of chronic obstructive pulmonary disease, however, there was no evidence of exacerbation. His kidney disease is stable. One out of two blood cultures drawn on 08/07/2011 grew coagulase Staphylococcus which was felt to be a contaminant. Repeat blood cultures drawn on 08/07/2011 have been negative so far. The patient  diuresed well and lost a few pounds while in the hospital. He is being discharged home in a stable condition.   TIME SPENT: 45 minutes.  ____________________________ Darrick MeigsSangeeta Isabele Lollar, MD sp:slb D: 08/10/2011 15:57:12 ET T: 08/12/2011 09:11:41 ET JOB#: [id number]  cc: Darrick MeigsSangeeta Sweetie Giebler, MD, <Dictator> Darrick MeigsSANGEETA Leslieann Whisman MD ELECTRONICALLY SIGNED 08/12/2011 16:40

## 2014-09-11 NOTE — Discharge Summary (Signed)
PATIENT NAME:  Ronald [name], Parker P MR#:  [id number] DATE OF BIRTH:  [birth year]  DATE OF ADMISSION:  06/27/2011 DATE OF DISCHARGE:  06/30/2011  DISCHARGE DIAGNOSES:  1. Acute on chronic respiratory failure due to decompensated acute CHF exacerbation, possible acute bronchitis.  2. Atrial fibrillation. 3. Elevated troponin due to demand ischemia. 4. Chronic obstructive pulmonary disease. 5. Anemia. 6. Chronic renal insufficiency.  7. Hyperlipidemia.  8. Diabetes.   DISPOSITION: The patient is being discharged home with home health and physical therapy.   DIET: Low sodium, 1800 calorie ADA diet.   ACTIVITY: As tolerated.   FOLLOW-UP:  1. The patient should have PT-INR check in two days. 2. [facility] [place] in 1 to 2 weeks after discharge.   DISCHARGE MEDICATIONS:  1. Oxygen 4 liters via nasal cannula.  2. DuoNebs q.6 hours p.r.n.  3. Coumadin 5 mg daily. Start from 07/03/2011.  4. Tussionex 5 mL p.o. b.i.d. p.r.n. cough.  5. Ambien 5 mg p.o. at bedtime p.r.n. insomnia. 6. Benzonatate 100 mg q.6 hours p.r.n. cough. 7. Augmentin 875 mg b.i.d. for seven days.  8. Integra F 1 capsule at bedtime. 9. Pravachol 80 mg at bedtime. 10. Combivent 2 puffs q.4 to 6 hours p.r.n.  11. Advair 250/50 1 puff b.i.d.  12. Imdur 30 mg daily.  13. Lasix 80 mg in the morning and 40 mg in the evening. 14. Carafate 1 gram t.i.d.  15. AcipHex 20 mg daily.  16. Coreg 12.5 mg b.i.d.  17. Glyburide 6 mg daily.  18. KCl 20 mEq daily.  19. Amlodipine 10 mg daily.  20. Hydralazine 50 mg q.8 hours.   LABORATORY, DIAGNOSTIC, AND RADIOLOGICAL DATA: Chest x-ray showed pulmonary vascular congestion. B12 is normal. Urinalysis showed no evidence of infection. INR was 4.1 on admission and 3.6 by the time of discharge. CBC showed hemoglobin ranging from 9.1 to 9.7. Normal platelet count. Chronic kidney disease with creatinine ranging from 1.96 to 2.05. BNP 8881. Iron  studies showed anemia of chronic disease. Mildly elevated troponin 0.07.   HOSPITAL COURSE: The patient is a 79 year old male with multiple medical problems who presented with acute onset of shortness of breath. He was found to have acute on chronic respiratory failure due to decompensated acute congestive heart failure. The patient was started on diuresis with IV Lasix with good symptom control. The patient also had possible bronchitis. He had low-grade fever and cough with expectoration which was treated with oral antibiotics. The patient reported some blood-tinged phlegm, however, that could be due to elevated INR. The patient's INR was 4.1 on admission. His Coumadin has been placed on hold. He has been advised to start Coumadin in 2 to 3 days after getting a PT-INR check-up in two days. The patient had mildly elevated troponin which was felt to be due to demand ischemia. The patient's chronic obstructive pulmonary disease remained stable during the hospitalization. He had anemia of chronic disease. Iron studies were consistent with that. His B12 level was normal. The patient has recently restarted on p.r.n. oxygen, however, during the hospitalization he required continuous oxygen. The patient ambulated well with a walker; however, his SAO2 fell with activity. The patient reported that he was stable with no worsening of symptoms and wanted to be discharged home. He was offered rehab/placement but the patient was insistent that he was more comfortable at home and wanted to go home. Therefore, he is being discharged home in a stable condition. He will  follow-up with his primary care physician in 1 to 2 weeks after discharge.     TIME SPENT: 45 minutes.  ____________________________ Darrick Meigs, MD sp:drc D: 06/30/2011 15:09:30 ET T: 06/30/2011 15:27:48 ET JOB#: [id number]  cc: Darrick Meigs, MD, <Dictator> Darrick Meigs MD ELECTRONICALLY SIGNED 06/30/2011 21:08

## 2014-09-11 NOTE — Consult Note (Signed)
Brief Consult Note: Diagnosis: CHF, multifactorial, responding to furosemide.   Patient was seen by consultant.   Consult note dictated.   Comments: REC  Agree with current therapy, cont diuresis, follow BUN and Cr closely, defer further cardiac w/u at this time.  Electronic Signatures: Marcina MillardParaschos, Jon Kasparek (MD)  (Signed 21-Mar-13 13:28)  Authored: Brief Consult Note   Last Updated: 21-Mar-13 13:28 by Marcina MillardParaschos, Dot Splinter (MD)

## 2014-09-11 NOTE — Discharge Summary (Signed)
PATIENT NAME:  Ronald [name], Ronald [name] MR#:  [id number] DATE OF BIRTH:  11-28-1935  DATE OF ADMISSION:  05/19/2011 DATE OF DISCHARGE:  05/22/2011  PRIMARY CARE PHYSICIAN:  Dr. Maryjane HurterFeldpausch.   REASON FOR ADMISSION: Shortness of breath.   DISCHARGE DIAGNOSES:  1. Acute hypoxic respiratory failure due to congestive heart failure and chronic obstructive pulmonary disease exacerbation.  2. Acute on chronic systolic congestive heart failure exacerbation. Ejection fraction 40%. 3. Acute chronic obstructive pulmonary disease exacerbation, likely due to congestive heart failure and possible acute bronchitis. 4. Paroxysmal atrial fibrillation. 5. History of coronary artery disease.  6. History of chronic kidney disease, unknown stage and unknown baseline renal function. The patient will follow-up with nephrology as an outpatient. 7. History of type 2 diabetes mellitus.  8. History of peripheral vascular disease. 9. History of lower extremity deep venous thrombosis. 10. History of hypertension. 11. History of cerebrovascular accident.  12. History of obesity. 13. History of irritable bowel syndrome.  14. History of cholecystectomy.  15. History of recent pneumonia.  16. History of right lower extremity cellulitis.  17. History of coronary artery disease status post myocardial infarction.   DISCHARGE MEDICATIONS:  1. Coumadin 2 mg [name].o. daily.  2. Prednisone taper as written on prescription, down to maintenance dose of 5 mg per day.  3. Doxycycline 100 mg [name].o. every 12 hours times three days.  4. Omnicef 300 mg [name].o. every 12 hours times three daily.  5. Advair Diskus 250/50, one dose inhaled twice a day. 6. Spiriva HandiHaler one cap inhaled daily.  7. Albuterol metered dose inhaler 1 to 2 puffs inhaled every 4 to 6 hours [name].r.n.  8. K-Lor packet 20 mEq [name].o. b.i.d.  9. Lasix 40 mg [name].o. daily. 10. Hydralazine 50 mg [name].o. q.8 hours. 11. Amlodipine 10 mg daily.  12. Pravastatin 80 mg  daily. 13. Benzonatate 200 mg [name].o. t.i.d. [name].r.n. cough. 14. Glyburide micronized 6 milligrams [name].o. daily. 15. Carvedilol 12.5 mg [name].o. b.i.d.  16. Aspirin 81 mg daily.  17. AcipHex 20 mg daily.  18. Ranitidine 150 mg [name].o. b.i.d.  19. Carafate 10 mg [name].o. t.i.d.  20. Align 4 mg [name].o. daily.  21. Integra-F oral capsule 1 cap [name].o. daily. 22. Imdur 30 mg [name].o. daily.   DISCHARGE DISPOSITION: Home.   DISCHARGE CONDITION: Improved, stable.   DISCHARGE ACTIVITY: As tolerated.   DISCHARGE DIET: Low sodium, low fat, low cholesterol ADA.   DISCHARGE INSTRUCTIONS:  1. Take medications as prescribed.  2. Return to the Emergency Department for recurrence of symptoms.   FOLLOW-UP INSTRUCTIONS:  1. Follow-up with Dr. Maryjane HurterFeldpausch within 1 to 2 weeks. The patient needs repeat BMP and repeat PT/INR in the next 2 to 3 days. 2. [facility] to 2 weeks, Dr. Ellis SavageKiser, and the patient needs reassessment of his renal function within a week, preferably two to three days by checking BMP which can be checked by his primary care physician and results can be [facility].  3. Follow-up with Dr. Darrold JunkerParaschos within one to two weeks.  4. Follow-up with Dr. Meredeth IdeFleming within one to two weeks.  5. Follow-up with Dr. Marva PandaSkulskie within 2 to 3 weeks.  6. Follow-up with Dr. Wyn Quakerew within 2 to 3 weeks.   CONSULTATIONS:  1. Cardiology, Dr. Gwen PoundsKowalski.  2. Pulmonology, Dr. Belia HemanKasa.   PROCEDURES:  1. Portable chest x-ray 05/19/2011: Overall findings most concerning for mild pulmonary edema.  2. Chest x-ray PA and lateral 05/20/2011: Persistent changes of  pulmonary vascular congestion suggestive of congestive heart failure superimposed on chronic obstructive pulmonary disease.  3. 2-D echocardiogram 05/20/2011: Mild global hypokinesis of the left ventricle with ejection fraction 40%. Left atrium is moderately dilated. Right atrium is moderately dilated. Moderate TR.   PERTINENT LABORATORY DATA: EKG  on admission revealed normal sinus rhythm with incomplete left bundle branch block. Blood cultures x2 from 05/19/2011: No growth to date. INR is 2.6 on admission and 3 on the day of discharge. CBC on admission: WBC 12.4, hemoglobin 11.9, hematocrit 36.2, platelets 258. Cardiac enzymes negative x3 sets. LFTs normal on admission. BNP 6,172 on admission. BUN 34, creatinine 1.86 on admission with BUN 53, creatinine 2.38 on the day of discharge. Hemoglobin A1c was 7.4.  BRIEF HISTORY/HOSPITAL COURSE: The patient is a 79 year old male with extensive past medical history including hypertension, diabetes mellitus, obesity, coronary artery disease, cerebrovascular accident, deep vein thrombosis, congestive heart failure, atrial fibrillation, chronic obstructive pulmonary disease, irritable bowel syndrome and peripheral vascular disease, who presented to the Emergency Department with complaints of shortness of breath in addition to cough and wheezing. Please see dictated admission history and physical for further details surrounding the onset of this hospitalization. Please see below for further details.  1. Acute hypoxic respiratory failure. The patient was in moderate respiratory distress on arrival and hypoxic on room air consistent with acute hypoxic respiratory failure. Chest x-ray was obtained and suggestive of pulmonary edema, suspicious for congestive heart failure. He also had moderate wheezing on exam. He was diagnosed with acute hypoxic respiratory failure, congestive heart failure, and chronic obstructive pulmonary disease exacerbation. He was admitted to the telemetry unit and started on oxygen therapy which was titrated to keep his sats greater than 90%.  2. Acute on chronic systolic congestive heart failure exacerbation. Pulmonary edema was noted on chest x-ray and he was started on diuresis with IV Lasix. He was also maintained on Coreg, hydralazine and nitrate therapy. He was not given any ACE inhibitors  due to underlying renal insufficiency. Cardiology was consulted and Dr. Gwen Pounds was in agreement with overall medical management plans for congestive heart failure. The patient has diuresed well since admission with approximately 4 liters of diuresis and on the day of discharge, he has approximately 2.5 liters negative balance since admission with significant improvement of his shortness of breath as well as his lower extremity edema. Echocardiogram was obtained revealing ejection fraction 40% with moderate TR. The patient's renal function has worsened and some of this could be a Lasix effect. Therefore, he was switched off IV Lasix and reduced to once a day oral Lasix. (He was receiving Lasix twice a day in the hospital). He will need close monitoring of his renal function as an outpatient. We will follow-up with nephrology in this regard. Otherwise, for his congestive heart failure, he will continue to follow-up with cardiology as an outpatient with Dr. Darrold Junker.  3. Acute chronic obstructive pulmonary disease exacerbation, likely due to congestive heart failure and possible acute bronchitis given his productive cough as well as significant wheezing. He was afebrile. Blood cultures were obtained and thereafter, the patient will be started on IV antibiotics. He was also started on IV steroids for his acute chronic obstructive pulmonary disease exacerbation and was maintained on bronchodilator support. He was also maintained on nebs and metered dose inhalers in addition to Advair and Spiriva and his congestive heart failure was managed as above. With IV steroids and IV antibiotics, as well as bronchodilator support and management of the  patient's congestive heart failure, his breathing had essentially resolved and his shortness of breath has significantly improved. Once his condition had notably improved, he was switched off IV steroids onto prednisone taper which he will taper down to 5 mg per day which is a  maintenance dose for chronic obstructive pulmonary disease and he will follow up with pulmonology. He was also switched off IV antibiotics to [name].o. antibiotics and has completed three days of inpatient antibiotics and has four additional days of therapy remaining at the time of discharge. Pulmonology was consulted and Dr. Belia Heman was in agreement with overall medical management of the patient's chronic obstructive pulmonary disease and congestive heart failure as well as respiratory failure. With all the measures mentioned above, including diuresis, IV steroids, IV antibiotics, bronchodilator support, Advair and Spiriva, the patient has been successfully weaned off oxygen and has normal oxygen saturations on room air at rest and with ambulation.  4. Paroxysmal atrial fibrillation. The patient has remained in normal sinus rhythm throughout the majority of his hospitalization and EKG on admission revealed normal sinus rhythm. He will continue Coreg for heart rate control. He is on Coumadin at baseline and his INR is 3 on the day of discharge and this likely is a drug interaction between Coumadin and antibiotic therapy. Therefore, his Coumadin dose has been reduced and he will need repeat PT/INR within the next 2 to 3 days per his primary care physician.  5. Coronary artery disease status post myocardial infarction. Cardiac enzymes are negative x3 sets. The patient was without any chest pain. For now, cardiology recommends continuation of medical management with aspirin, Coumadin, beta blocker, nitrate and statin therapy and the patient will need close outpatient follow up with cardiology.  6. Chronic kidney disease, with unknown stage, unknown baseline renal function. The patient follows with [facility]. His renal function on 05/22/2011 is slightly improved from 05/22/2011 after reducing his Lasix dose. Therefore some worsening of his renal function was felt to be secondary to a diuretic effect. He has remained  nonoliguric. He will continue to follow up with [facility] upon hospital discharge and will need to have his renal function reassessed within one week.  7. Type 2 diabetes mellitus. The patient is to continue glyburide. He was given some sliding scale insulin during this hospitalization and his sugars were higher than baseline since he is on IV steroids. Hemoglobin A1c was 7.4.   8. Peripheral vascular disease. The patient is to continue Coumadin, aspirin and statin therapy and he will continue to follow up with vascular surgery as an outpatient. 9. History of lower extremity deep venous thrombosis. The patient is to continue Coumadin. As above, his INR is 3 on the day of discharge and this is likely a drug interaction with antibiotic therapy and his Coumadin dose has ben decreased from baseline and he will need repeat PT/INR in the next two to three days per his primary care physician.  10. Hypertension. Blood pressure is well controlled on the day of discharge. The patient is to continue Lasix, Norvasc, Imdur, Coreg and hydralazine therapy.  11. Chronic kidney disease. The patient is to continue aspirin, Coumadin and statin.  12. Obesity. The patient was counseled on the importance of weight loss.  13. History of irritable bowel syndrome, stable. The patient is to continue to followup with his gastrologist upon discharge.   On 05/21/2010, the patient was hemodynamically stable with significant improvement of his shortness of breath as well as cough and wheezing had  resolved and he was felt to be stable for discharge home with close outpatient follow-up to which the patient was agreeable.          TIME SPENT ON DISCHARGE: Greater than 30 minutes.   ____________________________ Elon Alas, MD knl:ap D: 05/26/2011 19:05:33 ET T: 05/28/2011 11:20:59 ET JOB#: [id number] cc: Elon Alas, MD, <Dictator> Marina Goodell, MD Marcina Millard, MD [facility], Dr.  Harolyn Rutherford E. Meredeth Ide, MD Christena Deem, MD Annice Needy, MD Elon Alas MD ELECTRONICALLY SIGNED 06/06/2011 18:48

## 2014-09-11 NOTE — H&P (Signed)
PATIENT NAME:  Ronald [name], Denham P MR#:  [id number] DATE OF BIRTH:  1935-07-23  DATE OF ADMISSION:  06/27/2011  PRIMARY CARE PHYSICIAN: Dr. Maryjane HurterFeldpausch   CHIEF COMPLAINT: Shortness of breath and chest pain.   HISTORY OF PRESENT ILLNESS: This is a 79 year old male who presents to the Emergency Room today due to shortness of breath and chest pain. Patient said he was in his usual state of health when this afternoon he started to develop some bloating in the stomach and then shortly thereafter became short of breath and started developing some chest tightness. He thought it was related to heartburn. He tried taking TUMS and also attempt taking some Maalox although it did not alleviate his symptoms. He then took some sublingual nitroglycerin which seemed to alleviate his symptoms. His chest pain also got improved with nitroglycerin. He therefore was concerned and therefore came to the ER for further evaluation. In the Emergency Room patient was noted to be slightly hypoxic and was noted to be in mild congestive heart failure consistent with his chest x-ray findings. Hospitalist services were then contacted for further treatment and evaluation.   REVIEW OF SYSTEMS: CONSTITUTIONAL: No documented fever. Patient cannot recall if he has gained any weight. No weight loss. HEENT: No blurry or double vision. ENT: No tinnitus. No postnasal drip. No redness of the oropharynx. RESPIRATORY: Positive cough. No wheeze. No hemoptysis. Positive dyspnea. CARDIOVASCULAR: Positive chest pain. No orthopnea, no palpitations, no syncope. GASTROINTESTINAL: No nausea, no vomiting, no diarrhea, no abdominal pain, no melena, no hematochezia. GENITOURINARY: No dysuria, no hematuria. ENDOCRINE: No polyuria or nocturia. No heat or cold intolerance. HEME: No anemia, no bruising, no bleeding. INTEGUMENTARY: No rashes. No lesions. MUSCULOSKELETAL: No arthritis, no swelling, no gout. NEUROLOGIC: No numbness, no tingling, no ataxia, no seizure-type  activity. PSYCH: No anxiety, no insomnia, no ADD.   PAST MEDICAL HISTORY:  1. History of congestive heart failure.  2. History of chronic obstructive pulmonary disease. 3. Cardiomyopathy with ejection fraction of 40%. 4. History of chronic atrial fibrillation. 5. Peripheral vascular disease.  6. Type 2 diabetes. 7. History of previous cerebrovascular accident. 8. Hypertension. 9. Hyperlipidemia. 10. History of chronic lower extremity edema. 11. Chronic kidney disease, stage III.   ALLERGIES: Levaquin, Lipitor and Z-Pak.   SOCIAL HISTORY: No smoking. No alcohol abuse. No illicit drug abuse. Lives at home with his wife.   FAMILY HISTORY: Positive for coronary artery disease, diabetes, and hypertension.   CURRENT MEDICATIONS: Based on his recent discharge summary are as follows:  1. Coumadin 2 mg daily.  2. Advair 250/50, 1 puff b.i.d.  3. Spiriva 1 puff daily.  4. Albuterol inhaler 1 to 2 puffs every four hours as needed.  5. Lasix 40 mg b.i.d.  6. Potassium 20 mEq b.i.d.  7. Hydralazine 50 mg every eight hours.  8. Amlodipine 10 mg daily.  9. Pravachol 80 mg daily.  10. Glyburide 6 mg daily.  11. Coreg 12.5 mg b.i.d.  12. Aspirin 81 mg daily.  13. AcipHex 20 mg daily.  14. Ranitidine 150 mg b.i.d.  15. Carafate 1 gram q.i.d.  16. Align 4 mg daily. 17. Integra-F 1 tab daily.  18. Imdur 30 mg daily.   PHYSICAL EXAMINATION ON ADMISSION:  VITAL SIGNS: Temperature 97.4, pulse 108, respirations 28, blood pressure 139/76, sats 90% on 2 liters nasal.   GENERAL: He is an obese male lying in bed in mild respiratory distress.   HEENT: He is atraumatic, normocephalic. His extraocular muscles are intact. His  pupils are equal, reactive to light. His sclerae is anicteric. No conjunctival injection. No oropharyngeal erythema.   NECK: Supple. There is no jugular venous distention. No bruits, no lymphadenopathy, no thyromegaly.   HEART: Regular rate and rhythm. No murmurs, no rubs, no  clicks.   LUNGS: He has some coarse crackles at the bases. Negative use of accessory muscles. No dullness to percussion. No wheezing. No rhonchi.   ABDOMEN: Soft, distended. Good bowel sounds. No hepatosplenomegaly appreciated.   EXTREMITIES: Both his extremities are wrapped in Unna boots and they are chronically edematous. Difficult to palpate pulses pedally but does have +2 pulses radially. No cyanosis. No clubbing.   NEUROLOGIC: He is alert, awake, oriented x3 with no focal motor or sensory deficits dysphasia bilaterally.   SKIN: Moist, warm. He does have significant bruising bilaterally in the upper extremities from the use of Coumadin.   LYMPHATIC: There is no cervical or axillary lymphadenopathy.   LABORATORY, DIAGNOSTIC AND RADIOLOGICAL DATA: BNP 8800, serum glucose 287, BUN 40, creatinine 1.9, sodium 136, potassium 5.1, chloride 101, bicarbonate 27. LFTs are within normal limits. Albumin 2.8. Troponin 0.04. White cell count 11.1, hemoglobin 9.3, hematocrit 27.8, platelet count 263.   Patient did have a chest x-ray done which showed findings suggestive of bilateral heterogeneous opacities representing pulmonary edema.      ASSESSMENT AND PLAN: This is a 79 year old male with history of congestive heart failure, chronic obstructive pulmonary disease, cardiomyopathy, ejection fraction 40%, chronic atrial fibrillation, history of previous cerebrovascular accident, hypertension, obesity, history of coronary artery disease and peripheral vascular disease who presents to the hospital with shortness of breath and noted to be in decompensated congestive heart failure.  1. Acute on chronic respiratory failure. This is likely in the setting of decompensated congestive heart failure. Will diurese the patient with IV Lasix. Follow strict ins and outs and daily weights. Continue his beta blockers. Continue hydralazine and Imdur. Patient is not on an ACE inhibitor given his renal failure. Patient does  have underlying chronic obstructive pulmonary disease but I do not appreciate any chronic obstructive pulmonary disease exacerbation at this point.  2. Decompensated congestive heart failure. This is acute on chronic systolic dysfunction. He has had a recent echocardiogram done in December of last year with ejection fraction showing 40%. I will continue him on IV Lasix for now, follow ins and outs and daily weights. Continue beta blockers. Continue Imdur and hydralazine for now.  3. Chronic atrial fibrillation. Currently rate controlled. Continue Coreg. His coags are still pending. I will dose his Coumadin accordingly based on his PT-INR results.  4. Chronic obstructive pulmonary disease. As mentioned no evidence of acute exacerbation. Continue Advair, Spiriva and p.r.n. nebulizer treatments.  5. Chronic renal failure, stage III. Patient's creatinine is at baseline. I will renal dose his medications, avoid nephrotoxins. Follow his renal function closely as he is going to be on diuretics.  6. Hyperlipidemia. Continue Pravachol. 7. Diabetes. Continue glyburide and sliding scale insulin.  8. Gastroesophageal reflux disease. Continue with Carafate, ranitidine for now.  9. CODE STATUS: Patient is a FULL CODE.   TIME SPENT: 50 minutes.  ____________________________ Rolly Pancake. Cherlynn Kaiser, MD vjs:cms D: 06/27/2011 20:54:18 ET T: 06/28/2011 06:32:02 ET JOB#: [id number]  cc: Rolly Pancake. Cherlynn Kaiser, MD, <Dictator> Marina Goodell, MD Houston Siren MD ELECTRONICALLY SIGNED 07/01/2011 3:14

## 2014-09-11 NOTE — H&P (Signed)
PATIENT NAME:  Francesco SorKNIGHT, Ubaldo P MR#:  [id number] DATE OF BIRTH:  1935/10/09  DATE OF ADMISSION:  08/07/2011  PRIMARY CARE PHYSICIAN: Dr. Bari EdwardLaura Berglund, [place], [facility] [place]    CHIEF COMPLAINT: Increasing shortness of breath, not feeling well.   HISTORY OF PRESENT ILLNESS: Mr. Terrilee CroakKnight is a 79 year old Caucasian gentleman well known to our service from previous admissions, most recent one was February 7th to February 10th with respiratory failure due to CHF, who comes in today with increasing shortness of breath and not feeling well along with cough which has not been resolving. He denies any fever. He woke up this morning feeling very short of breath. He took a couple of nebulizer treatments, did not feel well, hence came to the Emergency Room where he was found to be with sats in the 80's despite his use of oxygen at 4 liters per minute. The patient was found to be in heart failure along with chest x-ray concerning for possible right lower lobe infiltrate. He received one dose of IV Rocephin and 40 mg of IV Lasix. Currently sats are 88 to 93% on 4 liters nasal cannula oxygen. His B-type Natriuretic Peptide is 9881. The patient is being admitted for further evaluation and management.   PAST MEDICAL HISTORY: 1. Chronic respiratory failure on home oxygen due to chronic CHF, systolic, along with COPD.  2. Chronic atrial fibrillation, on Coumadin.  3. Chronic anemia.  4. CKD, stage III, baseline around 1.5 to 2.  5. Anemia of chronic disease.  6. Hyperlipidemia.  7. Diabetes.  8. Morbid obesity.  9. History of peripheral vascular disease. 10. Bilateral knee degenerative joint disease, on narcotics.   ALLERGIES: Levaquin, Lipitor, and Z-Pak.  MEDICATIONS:  1. AcipHex 20 mg p.o. daily.  2. Advair 250/50 one puff b.i.d.  3. Albuterol ipratropium 2 puffs q.4 to 6 hours.  4. Ambien 5 mg at bedtime.  5. Amlodipine 10 mg daily.  6. Benzonatate 200 mg every six hours as needed for cough.   7. Carafate 10 mg 3 times a day.  8. Coreg 12.5 p.o. b.i.d.  9. Coumadin 5 mg p.o. daily.  10. DuoNebs as needed.  11. Lasix 40 mg 2 tablets in the morning, 1 tablet in the afternoon.  12. Glyburide 6 mg p.o. daily.  13. Hydralazine 50 mg 3 times a day.  14. Imdur 30 mg extended-release daily.  15. Integra F oral capsule p.o. at bedtime.  16. K-Lor 1 tablet daily. I do not know the dosage on it.  17. Pravastatin 40 mg 2 tablets daily.  18. Tussionex 5 mg twice a day.  19. Zantac 150 mg b.i.d.   SOCIAL HISTORY: Lives at home with his wife. No smoking. No alcohol use.   FAMILY HISTORY: Positive for coronary artery disease, diabetes, and hypertension.    REVIEW OF SYSTEMS: CONSTITUTIONAL: Positive for fatigue, weakness. EYES: No blurred or double vision. ENT: No tinnitus, ear pain, hearing loss. RESPIRATORY: Positive for cough, shortness of breath. CARDIOVASCULAR: No chest pain. Positive for orthopnea and shortness of breath. Positive for chronic lower extremity edema. GI: No nausea, vomiting, diarrhea, abdominal pain. No gastroesophageal reflux disease. GU: No dysuria or hematuria. ENDOCRINE: No polyuria or nocturia. HEMATOLOGY: Positive for chronic anemia. SKIN: No acne or rash. MUSCULOSKELETAL: Positive for arthritis and knee degenerative joint disease. NEUROLOGIC: No CVA or TIA. PSYCH: No anxiety or depression. All other systems reviewed and negative.   PHYSICAL EXAMINATION:   GENERAL: The patient is awake, alert, oriented x3, mild to  moderate distress due to shortness of breath. He is morbidly not in acute distress.  VITAL SIGNS: He is afebrile, pulse 107 to 110, atrial fibrillation, respirations 22 per minute, sats are 94 on 4 liters, blood pressure 149/83.   HEENT: Atraumatic, normocephalic. Pupils equal, round, and reactive to light and accommodation. Extraocular movements intact. Oral mucosa is moist.   NECK: Supple. No JVD. No carotid bruit.   RESPIRATORY: Decreased breath  sounds in the bases. Crackles heard on the right base more than the left. No use of accessory muscles. No rhonchi.   CARDIOVASCULAR: Tachycardia present. Irregular heart rhythm. No murmur heard. PMI not lateralized. Chest nontender.  EXTREMITIES: Chronic lower extremity edema, 4+, pitting in nature. Could not appreciate any lower extremity pulses secondary to dependent edema.   NEUROLOGIC: Grossly intact cranial nerves II through XII. No motor or sensory deficit.   ABDOMEN: Obese, soft, nontender. No organomegaly. Positive bowel sounds.   SKIN: Warm and dry. Has chronic venostasis changes in lower extremities.   PSYCH: The patient is awake, alert, and oriented x3.   LABORATORY, DIAGNOSTIC, AND RADIOLOGICAL DATA: Chest x-ray consistent with pulmonary edema versus pneumonitis secondary to an infectious etiology. There is prominence in the central vasculature. No pleural effusion. Bilateral interstitial alveolar airspace opacities.   B-type Natriuretic Peptide 9881. Cardiac enzymes negative. White count 12.5, hemoglobin and hematocrit 11.1 and 34.1, platelet count 268, glucose 115, BUN 23, creatinine 1.56. The rest of the electrolytes along with LFTs are normal. PT-INR 23.9 and 2.1. Magnesium 2.6. TSH 2.26. EKG shows atrial fibrillation.   ASSESSMENT AND PLAN: 79 year old Mr. Schurman with:  1. Acute on chronic hypoxic respiratory failure due to CHF exacerbation, acute on chronic systolic with suspected right lower lobe pneumonia.  2. Systemic inflammatory response syndrome due to pneumonia, right lower lobe, patient with tachycardia, elevated white count, and abnormal chest x-ray.  3. Acute on chronic atrial fibrillation, on Coumadin with INR therapeutic.  4. Chronic leg edema.  5. Chronic anemia.  6. CKD, stage III.  7. Type II diabetes.  8. Morbid obesity.   PLAN:  1. Admit patient to telemetry floor.  2. Start patient on Lasix 40 mg IV t.i.d. Check I's and O's, daily weights, taper Lasix  as tolerated and watch creatinine closely.  3. IV Rocephin and doxycycline for pneumonia. The patient has allergies to Levaquin and Zithromax.  4. DuoNeb inhalers around-the-clock.   5. No indication for steroids. The patient is not wheezing at present. If needed, start as indicated.  6. Sliding scale insulin with p.o. glyburide. 7. Continue Imdur, hydralazine, and Coreg.  8. Will also continue warfarin. Check PT/INR daily in the setting of IV antibiotics. Watch closely the dose.  9. Physical therapy.  10. Care Management for discharge planning.   Further work-up according to the patient's clinical course. Hospital admission plan was discussed with the patient and family members.   CODE STATUS: The patient is a FULL CODE.   TIME SPENT: 50 minutes.   ____________________________ Wylie Hail Allena Katz, MD sap:drc D: 08/07/2011 10:08:38 ET T: 08/07/2011 10:43:23 ET JOB#: [id number]  cc: Tacari Repass A. Allena Katz, MD, <Dictator> Bari Edward, MD Willow Ora MD ELECTRONICALLY SIGNED 08/26/2011 6:33

## 2014-09-11 NOTE — Consult Note (Signed)
PATIENT NAME:  Ronald [name], Ronald P MR#:  [id number] DATE OF BIRTH:  08-27-1935  DATE OF CONSULTATION:  08/08/2011  REFERRING PHYSICIAN:  Dr. Allena KatzPatel  CONSULTING PHYSICIAN:  Marcina MillardAlexander Lichelle Viets, MD  PRIMARY CARE PHYSICIAN: Dr. Judithann GravesBerglund   CHIEF COMPLAINT: Shortness of breath.   HISTORY OF PRESENT ILLNESS: The patient is a 79 year old gentleman admitted at this time with fluid retention with congestive heart failure. The patient has had multiple prior admissions for congestive heart failure. The patient awoke on 08/07/2011 feeling short of breath. [facility] where he was noted to be hypoxic with oxygen saturations in the 80's. Chest x-ray also revealed evidence for possible right lower lobe pneumonia. The patient was admitted with the presumptive diagnosis of CHF and pneumonia. Notable admission labs were a troponin of 0.02. BUN and creatinine were slightly elevated at 27 and 1.83, respectively. The patient has been treated with intravenous Lasix with diuresis and clinical improvement.   PAST MEDICAL HISTORY:  1. Congestive heart failure.  2. Chronic obstructive pulmonary disease.  3. Chronic atrial fibrillation.  4. Chronic kidney disease.  5. Chronic anemia.  6. Hyperlipidemia.  7. Diabetes.  8. Obesity.  9. History of peripheral vascular disease. 10. Venostasis due to venous insufficiency.   MEDICATIONS:  1. Coumadin 5 mg daily.  2. Amlodipine 10 mg daily.   3. Carvedilol 12.5 mg b.i.d.  4. Lasix 80 mg q.a.m., 40 mg q.p.m.  5. Hydralazine 50 mg t.i.d.  6. Imdur 30 mg daily.  7. K-Dur 1 daily.  8. Pravastatin 80 mg daily.  9. AcipHex 20 mg daily.  10. Advair 250/50 one puff b.i.d.  11. Albuterol 2 puffs q.4 to 6 hours. 12. Ambien 5 mg at bedtime.  13. Benzonatate 200 mg q.6 p.r.n.  14. Carafate 10 mg t.i.d.  15. DuoNebs p.r.n.  16. Glyburide 6 mg daily.  17. Tussionex 5 mg b.i.d.  18. Zantac 150 mg b.i.d.   SOCIAL HISTORY: The patient is married, lives  with his wife. He has a remote tobacco abuse history.   FAMILY HISTORY: Positive for coronary artery disease.   REVIEW OF SYSTEMS: CONSTITUTIONAL: No fever or chills. EYES: No blurry vision. EARS: No hearing loss. RESPIRATORY: Shortness of breath as described above. CARDIOVASCULAR: The patient denies chest pain. GI: The patient denies nausea, vomiting, diarrhea, or constipation. GU: The patient denies dysuria or hematuria. ENDOCRINE: The patient denies polyuria or polydipsia. HEMATOLOGICAL: The patient denies easy bruising or bleeding. MUSCULOSKELETAL: The patient does have some generalized arthralgias. NEUROLOGICAL: The patient denies focal muscle weakness or numbness. PSYCHOLOGICAL: The patient denies depression or anxiety.   PHYSICAL EXAMINATION:   VITAL SIGNS: Blood pressure 127/78, pulse 64, respirations 20, temperature 97.3, pulse oximetry 99% on 3 liters.   HEENT: Pupils equal, reactive to light and accommodation.   NECK: Supple without thyromegaly.   LUNGS: Decreased breath sounds in both bases.   CARDIOVASCULAR: Normal JVP. Normal PMI. Regular rate and rhythm. Normal S1, S2. No appreciable gallop, murmur, or rub.   ABDOMEN: Soft, nontender without hepatosplenomegaly.   EXTREMITIES: There is 2+ bilateral pedal edema extending up to both knees.   MUSCULOSKELETAL: Normal muscle tone.   NEUROLOGIC: The patient is alert and oriented x3. Motor and sensory both grossly intact.   IMPRESSION: This is a 79 year old gentleman with recurrent history of CHF with fluid retention, likely multifactorial secondary to diastolic dysfunction, COPD, and chronic kidney disease.   RECOMMENDATIONS:  1. Agree with overall current therapy.  2. Continue diuresis with  intravenous furosemide. Carefully monitor BUN and creatinine. 3. No further noninvasive or invasive cardiac evaluation at this time.   ____________________________ Marcina Millard, MD ap:drc D: 08/08/2011 13:26:37 ET T: 08/08/2011  13:50:21 ET JOB#: [id number]  cc: Marcina Millard, MD, <Dictator> Marcina Millard MD ELECTRONICALLY SIGNED 08/09/2011 8:49
